# Patient Record
Sex: Male | Born: 1950 | Race: White | Hispanic: No | Marital: Married | Smoking: Former smoker
Health system: Southern US, Community
[De-identification: ages and names within clinical notes are randomized; demographics above are authoritative.]

## PROBLEM LIST (undated history)

## (undated) DIAGNOSIS — Z8614 Personal history of Methicillin resistant Staphylococcus aureus infection: Secondary | ICD-10-CM

## (undated) DIAGNOSIS — E7800 Pure hypercholesterolemia, unspecified: Secondary | ICD-10-CM

## (undated) DIAGNOSIS — Z860100 Personal history of colon polyps, unspecified: Secondary | ICD-10-CM

## (undated) DIAGNOSIS — R350 Frequency of micturition: Secondary | ICD-10-CM

## (undated) DIAGNOSIS — R3915 Urgency of urination: Secondary | ICD-10-CM

## (undated) DIAGNOSIS — N433 Hydrocele, unspecified: Secondary | ICD-10-CM

## (undated) DIAGNOSIS — G4730 Sleep apnea, unspecified: Secondary | ICD-10-CM

## (undated) DIAGNOSIS — G4733 Obstructive sleep apnea (adult) (pediatric): Secondary | ICD-10-CM

## (undated) DIAGNOSIS — E119 Type 2 diabetes mellitus without complications: Secondary | ICD-10-CM

## (undated) DIAGNOSIS — H60399 Other infective otitis externa, unspecified ear: Secondary | ICD-10-CM

## (undated) DIAGNOSIS — I10 Essential (primary) hypertension: Secondary | ICD-10-CM

## (undated) DIAGNOSIS — E785 Hyperlipidemia, unspecified: Secondary | ICD-10-CM

## (undated) DIAGNOSIS — M109 Gout, unspecified: Secondary | ICD-10-CM

## (undated) DIAGNOSIS — E1169 Type 2 diabetes mellitus with other specified complication: Secondary | ICD-10-CM

## (undated) DIAGNOSIS — R351 Nocturia: Secondary | ICD-10-CM

## (undated) DIAGNOSIS — E1165 Type 2 diabetes mellitus with hyperglycemia: Secondary | ICD-10-CM

## (undated) DIAGNOSIS — M1990 Unspecified osteoarthritis, unspecified site: Secondary | ICD-10-CM

## (undated) DIAGNOSIS — H6690 Otitis media, unspecified, unspecified ear: Secondary | ICD-10-CM

## (undated) HISTORY — DX: Other infective otitis externa, unspecified ear: H60.399

## (undated) HISTORY — DX: Hyperlipidemia, unspecified: E78.5

## (undated) HISTORY — DX: Essential (primary) hypertension: I10

## (undated) HISTORY — DX: Otitis media, unspecified, unspecified ear: H66.90

## (undated) HISTORY — DX: Pure hypercholesterolemia, unspecified: E78.00

## (undated) HISTORY — DX: Type 2 diabetes mellitus with hyperglycemia: E11.65

## (undated) HISTORY — DX: Gout, unspecified: M10.9

## (undated) HISTORY — DX: Type 2 diabetes mellitus with other specified complication: E11.69

## (undated) HISTORY — DX: Sleep apnea, unspecified: G47.30

---

## 2001-03-11 HISTORY — PX: OTHER SURGICAL HISTORY: SHX169

## 2002-10-27 ENCOUNTER — Inpatient Hospital Stay (HOSPITAL_COMMUNITY): Admission: AD | Admit: 2002-10-27 | Discharge: 2002-10-31 | Payer: Self-pay | Admitting: Orthopedic Surgery

## 2002-11-01 ENCOUNTER — Encounter (HOSPITAL_COMMUNITY): Admission: RE | Admit: 2002-11-01 | Discharge: 2002-12-01 | Payer: Self-pay | Admitting: Orthopedic Surgery

## 2004-05-05 ENCOUNTER — Emergency Department (HOSPITAL_COMMUNITY): Admission: EM | Admit: 2004-05-05 | Discharge: 2004-05-05 | Payer: Self-pay | Admitting: Emergency Medicine

## 2007-02-02 ENCOUNTER — Ambulatory Visit: Admission: RE | Admit: 2007-02-02 | Discharge: 2007-02-02 | Payer: Self-pay | Admitting: Pulmonary Disease

## 2007-02-19 ENCOUNTER — Ambulatory Visit: Payer: Self-pay | Admitting: Pulmonary Disease

## 2007-12-06 ENCOUNTER — Emergency Department (HOSPITAL_COMMUNITY): Admission: EM | Admit: 2007-12-06 | Discharge: 2007-12-06 | Payer: Self-pay | Admitting: Emergency Medicine

## 2008-05-16 ENCOUNTER — Emergency Department (HOSPITAL_COMMUNITY): Admission: EM | Admit: 2008-05-16 | Discharge: 2008-05-16 | Payer: Self-pay | Admitting: Emergency Medicine

## 2010-07-24 NOTE — Procedures (Signed)
Michael Odom, Michael Odom                ACCOUNT NO.:  [id number]   MEDICAL RECORD NO.:  [id number]          PATIENT TYPE:  OUT   LOCATION:  SLEEP LAB                     FACILITY:  [facility]   PHYSICIAN:  Barbaraann Share, MD,FCCPDATE OF BIRTH:  1950/07/20   DATE OF STUDY:  02/02/2007                            NOCTURNAL POLYSOMNOGRAM   REFERRING PHYSICIAN:  Ramon Dredge L. Juanetta Gosling, M.D.   LOCATION:  Sleep lab.   INDICATION FOR STUDY:  Hypersomnia with sleep apnea.   EPWORTH SLEEPINESS SCORE:  16   MEDICATIONS:   SLEEP ARCHITECTURE:  The patient had a total sleep time of 361 minutes  with decreased REM and very little slow-wave sleep.  Sleep onset latency  was very rapid at 2.5 minutes, and REM onset was normal as well.  Sleep  efficiency was mildly reduced at 89%.   RESPIRATORY DATA:  The patient underwent a split-night protocol, where  he was found to have 66 obstructive apneas, 19 central apneas, and 88  hypopneas over the entire night, for an apnea/hypopnea index of 29  events per hour.  The events were clearly worse in the supine position,  and there was very loud snoring noted throughout.  The patient met split-  night criteria a little after midnight and was placed on a CPAP mask and  titration was initiated.  The patient, from the very start, could not  tolerate even a CPAP pressure of 5 and was changed over to bilevel and  continued to have great difficulties.  The CPAP was discontinued by 2:00  a.m., because of the patient's complaint of shortness of breath  associated with the device.   OXYGEN DATA:  There is O2 desaturation as low as 80% with the patient's  obstructive events.   CARDIAC DATA:  No clinically significant arrhythmias were noted.   MOVEMENT-PARASOMNIA:  The patient was found to have 29 leg jerks, but  none with significant arousal or awakening.   IMPRESSIONS-RECOMMENDATIONS:  Moderate obstructive sleep apnea/hypopnea  syndrome, with an apnea/hypopnea index of 29  events per hour and O2  desaturation as low as 80%.  The patient could not tolerate CPAP or  bilevel at very low levels, due to claustrophobia and difficulty  breathing.  I would consider a trial of CPAP at a low level such as 8  centimeters with desensitization techniques and possibly a sedative  hypnotic short-term to  help with sleep onset.  Since the patient's sleep apnea is only moderate  in nature, other treatment options such as oral appliance or upper  airway surgery could also be considered.  Clinical correlation is  suggested.      Barbaraann Share, MD,FCCP  Diplomate, American Board of Sleep  Medicine  Electronically Signed     [facility]/MEDQ  D:  02/19/2007 16:31:57  T:  02/20/2007 11:29:05  Job:  [id number]

## 2010-07-27 NOTE — H&P (Signed)
Michael Odom, Michael Odom                           ACCOUNT NO.:  [id number]   MEDICAL RECORD NO.:  [id number]                   PATIENT TYPE:  INP   LOCATION:  A337                                 FACILITY:  [facility]   PHYSICIAN:  Vickki Hearing, M.D.           DATE OF BIRTH:  1950-10-12   DATE OF ADMISSION:  10/27/2002  DATE OF DISCHARGE:                                HISTORY & PHYSICAL   CHIEF COMPLAINT:  Staph infection, right knee.   HISTORY:  A 60 year old male status post incision and drainage for septic  arthritis.  This was done in [facility] by Dr. Ronnie Doss on October 22, 2002.  The patient was in the hospital for 3-4 days, and was placed on  clindamycin intravenously after cultures came back, and then sent home on  300 mg t.i.d. for 21 days.  He was told to see an orthopedist as soon as he  got home.  He was communicating through Dr. Juanetta Gosling.   The patient's symptoms first began approximately three weeks ago when he  started with an ear infection which was treated successfully with an  antibiotic.  He also had intermittent boils which would respond to  antibiotics.  He went to [place] for vacation, had an infection in his  ear treated with antibiotic drops, and then developed a boil in his right  knee, and that was treated with antibiotics.  He presented back for a follow  up visit at Urgent Care.  His ear was treated with irrigation, as well as  his knee through a local incision and drainage; however, his knee worsened,  and he presented to the [facility] and was taken to the  operating room as described above.  He grew MRSA Staphylococcal infection  sensitive to clindamycin.  After his drainage, he had intravenous  antibiotics.  Eventually switched to IV clindamycin, and p.o. clindamycin to  go home.  On this occasion seeing me today on October 27, 2002, his knee is  still red, on the medial aspect especially with cellulitis.  The inferior  pole  of the wound is open and draining serosanguinous fluid.  the superior  portion of the wound has three stitches, and the suture line is red and  cellulitis, as well.  The good thing is he has no pain in the knee and can  walk without a limp and has good passive range of motion.   REVIEW OF SYSTEMS:  He had fever and fatigue, joint pain, joint swelling,  itching.  He developed a rash of some kind, perhaps from antibiotics or pain  medication using Tylox.  EAR/NOSE/THROAT:  Poor hearing since the infections  started.  HEART:  Negative.  RESPIRATORY:  Negative.  GI:  Negative.  URINARY:  Negative.  NEUROLOGIC:  Negative.  ENDOCRINE:  Negative.  PSYCHIATRIC:  Negative.  IMMUNOLOGIC:  Negative.  LYMPHATIC:  Negative.  ALLERGIES:  1. SULFA.  2. BACITRACIN.  3. GUAIFENESIN.  4. VANCOMYCIN.   MEDICAL PROBLEMS:  1. Previous history of back problems with bulging disk.  2. Hypercholesterolemia.   PREVIOUS SURGERY:  Just on the knee on the right.   MEDICATIONS:  1. Lipitor 10 mg.  2. Ciprodex drops for ears.  3. Clindamycin.  4. Tylox.   FAMILY HISTORY:  Arthritis, cancer.   SOCIAL HISTORY:  Married.  No smoking, no drinking.  Caffeine _______.  Grades completed 1-11.   PHYSICAL EXAMINATION:  GENERAL APPEARANCE:  He is a well-developed, well-  nourished male.  Grooming and hygiene are normal.  He is large in stature.  He has no deformity.  VITAL SIGNS:  Recorded in the medical record.  CARDIOVASCULAR:  Swelling - none.  Edema and tenderness - none.  Varicose  veins - none.  Temperature normal, pulse is normal.  LYMPHATICS:  Regional lower leg, groin nodes are negative.  MUSCULOSKELETAL, GAIT, AND STATION:  He does not appear to limp on the right  leg.  The right leg has a longitudinal lateral incision.  The distal portion  is open.  The medial side of the leg is red and blanching, suggesting  cellulitis.  This area is not specifically tender, however.  He also has  three stitches at  the top of the wound, and the suture line is red and  blanching.  Range of motion is 10-100 without pain.  It is supple.  He has  gross normal motor strength and muscle tone.  His other three extremities  are not affected.  They are well aligned.  There are no boils.  The  stability and strength of the limbs are normal.  Range of motion is normal.  SKIN:  As stated on the right knee; otherwise, he has a rash on the chest  and back.  NEUROLOGIC:  He is intact.  He is alert and oriented x3.  There is no  depression or anxiety.   MEDICAL DECISION MAKING:  The notes from the Urgent Care Center, the  operative note, the discharge summary, the history and physical from [facility], along with culture reports and results are reviewed,  indicating the patient's history and bacterial infection.   IMPRESSION:  Septic arthritis.   PLAN:  Readmit for IV antibiotics.   We did discuss the possibility of incision and drainage on a second  occasion, but for now we will observe him on antibiotics.                                               Vickki Hearing, M.D.    SEH/MEDQ  D:  10/27/2002  T:  10/27/2002  Job:  [id number]

## 2010-07-27 NOTE — Group Therapy Note (Signed)
   NAMEKOAH, CHISENHALL                           ACCOUNT NO.:  [id number]   MEDICAL RECORD NO.:  [id number]                   PATIENT TYPE:  INP   LOCATION:  A337                                 FACILITY:  [facility]   PHYSICIAN:  Vickki Hearing, M.D.           DATE OF BIRTH:  1951/01/22   DATE OF PROCEDURE:  10/29/2002  DATE OF DISCHARGE:                                   PROGRESS NOTE   SUBJECTIVE/OBJECTIVE:  The patient is now on his third day of clindamycin  for a septic knee.  He was initially treated in [facility] and had follow-  up care here.  His knee continues to improve, but slowly.  He has no  increase in pain.  The cellulitis on the medial side of the knee continues  to improve and the wound continues to improve.  There is less erythema  around the suture line.  Again, he has no increase in pain.   ASSESSMENT:  Overall improving but slowly.  He has a new boil coming up  behind his left ear which is somewhat concerning since he is already on  clindamycin.   PLAN:  Continue lavage, continue clindamycin - I would like him to get a  full five-day course.  Tomorrow I would like to get a sed rate and C-  reactive protein repeat as well as a white count.                                               Vickki Hearing, M.D.    SEH/MEDQ  D:  10/29/2002  T:  10/29/2002  Job:  [id number]

## 2010-07-27 NOTE — Discharge Summary (Signed)
   Michael Odom, Michael Odom                           ACCOUNT NO.:  [id number]   MEDICAL RECORD NO.:  [id number]                   PATIENT TYPE:  INP   LOCATION:  A337                                 FACILITY:  [facility]   PHYSICIAN:  Vickki Hearing, M.D.           DATE OF BIRTH:  06-Aug-1950   DATE OF ADMISSION:  10/27/2002  DATE OF DISCHARGE:  10/31/2002                                 DISCHARGE SUMMARY   ADMISSION DIAGNOSIS:  Staphylococcus infection right knee.   HISTORY:  This is a 60 year old male who had an incision and drainage for  septic arthritis in [facility] by Dr. Ferd Glassing on October 22, 2002.  The  patient was in the hospital for 3 days and was placed on clindamycin  intravenously after cultures came back.  He was then sent home on 300 mg  t.i.d. for 21 days and told to see an orthopedist as well as he got home.   Once he got here in [place], I saw him in the office and his cellulitis  appeared to be returning, although he was not having a significant amount of  knee pain. He was admitted for IV clindamycin.  He had IV clindamycin 300 mg  3 times a day along with pulse lavage of the distal open wound and improved  and remained afebrile.  On August 18 his sedimentation rate was 58 on August  21 it was 46.  His C-reactive protein on the 18th showed 34.8.  On August 21  C-reactive protein was pending, but clinically he had improved.   DISPOSITION:  To home.   CONDITION:  Overall improved.   As outpatient should have physical therapy for continue pulse lavage and  range of motion exercises right knee.   FOLLOW UP:  Wednesday in the office.   DISCHARGE MEDICATIONS:  Clindamycin 300 mg t.i.d. p.o.  We will use that for  1 month. Also he has Tylox prescription already filled 1 q.4-6h. p.r.n. for  pain.  That can be changed to Lorcet or Lorcet Plus once that first  prescription has run out.                                               Vickki Hearing, M.D.    SEH/MEDQ  D:  10/31/2002  T:  11/01/2002  Job:  [id number]

## 2010-12-10 LAB — URINALYSIS, ROUTINE W REFLEX MICROSCOPIC
Hgb urine dipstick: NEGATIVE
Leukocytes, UA: NEGATIVE
Protein, ur: 30 — AB
Urobilinogen, UA: 0.2

## 2010-12-10 LAB — URINE MICROSCOPIC-ADD ON

## 2011-08-16 ENCOUNTER — Encounter (INDEPENDENT_AMBULATORY_CARE_PROVIDER_SITE_OTHER): Payer: Self-pay

## 2013-03-01 ENCOUNTER — Ambulatory Visit (HOSPITAL_COMMUNITY)
Admission: RE | Admit: 2013-03-01 | Discharge: 2013-03-01 | Disposition: A | Discharge status: Home / Self Care | Payer: Managed Care, Other (non HMO) | Source: Ambulatory Visit | Attending: Pulmonary Disease | Admitting: Pulmonary Disease

## 2013-03-01 ENCOUNTER — Other Ambulatory Visit (HOSPITAL_COMMUNITY): Payer: Self-pay | Admitting: Pulmonary Disease

## 2013-03-01 DIAGNOSIS — M898X9 Other specified disorders of bone, unspecified site: Secondary | ICD-10-CM | POA: Insufficient documentation

## 2013-03-01 DIAGNOSIS — M7730 Calcaneal spur, unspecified foot: Secondary | ICD-10-CM | POA: Insufficient documentation

## 2013-03-01 DIAGNOSIS — M25476 Effusion, unspecified foot: Secondary | ICD-10-CM | POA: Insufficient documentation

## 2013-03-01 DIAGNOSIS — M25473 Effusion, unspecified ankle: Secondary | ICD-10-CM | POA: Insufficient documentation

## 2013-03-01 DIAGNOSIS — M25572 Pain in left ankle and joints of left foot: Secondary | ICD-10-CM

## 2013-04-06 ENCOUNTER — Encounter: Payer: Self-pay | Admitting: Family Medicine

## 2013-08-17 ENCOUNTER — Encounter: Payer: Self-pay | Admitting: Family Medicine

## 2013-10-13 ENCOUNTER — Encounter (INDEPENDENT_AMBULATORY_CARE_PROVIDER_SITE_OTHER): Payer: Self-pay | Admitting: *Deleted

## 2013-10-26 ENCOUNTER — Other Ambulatory Visit (HOSPITAL_COMMUNITY): Payer: Self-pay | Admitting: Pulmonary Disease

## 2013-10-26 DIAGNOSIS — N5089 Other specified disorders of the male genital organs: Secondary | ICD-10-CM

## 2013-10-28 ENCOUNTER — Other Ambulatory Visit (HOSPITAL_COMMUNITY): Payer: Self-pay | Admitting: Pulmonary Disease

## 2013-10-28 ENCOUNTER — Ambulatory Visit (HOSPITAL_COMMUNITY)
Admission: RE | Admit: 2013-10-28 | Discharge: 2013-10-28 | Disposition: A | Discharge status: Home / Self Care | Payer: Managed Care, Other (non HMO) | Source: Ambulatory Visit | Attending: Pulmonary Disease | Admitting: Pulmonary Disease

## 2013-10-28 DIAGNOSIS — N433 Hydrocele, unspecified: Secondary | ICD-10-CM | POA: Insufficient documentation

## 2013-10-28 DIAGNOSIS — N5089 Other specified disorders of the male genital organs: Secondary | ICD-10-CM

## 2013-10-28 DIAGNOSIS — N508 Other specified disorders of male genital organs: Secondary | ICD-10-CM | POA: Diagnosis present

## 2013-11-11 ENCOUNTER — Other Ambulatory Visit (INDEPENDENT_AMBULATORY_CARE_PROVIDER_SITE_OTHER): Payer: Self-pay | Admitting: *Deleted

## 2013-11-11 ENCOUNTER — Telehealth (INDEPENDENT_AMBULATORY_CARE_PROVIDER_SITE_OTHER): Payer: Self-pay | Admitting: *Deleted

## 2013-11-11 DIAGNOSIS — Z1211 Encounter for screening for malignant neoplasm of colon: Secondary | ICD-10-CM

## 2013-11-11 DIAGNOSIS — Z86010 Personal history of colonic polyps: Secondary | ICD-10-CM

## 2013-11-11 NOTE — Telephone Encounter (Signed)
Patient needs movi prep 

## 2013-11-12 MED ORDER — PEG-KCL-NACL-NASULF-NA ASC-C 100 G PO SOLR: 1.0000 | Freq: Once | ORAL | Status: DC

## 2013-12-03 ENCOUNTER — Telehealth (INDEPENDENT_AMBULATORY_CARE_PROVIDER_SITE_OTHER): Payer: Self-pay | Admitting: *Deleted

## 2013-12-03 NOTE — Telephone Encounter (Signed)
PCP: hawkins   Procedure: tcs  Reason/Indication:  Hx polyps  Has patient had this procedure before?  Yes, 2008 --scanned  If so, when, by whom and where?    Is there a family history of colon cancer?  no  Who?  What age when diagnosed?    Is patient diabetic?   yes      Does patient have prosthetic heart valve?  no  Do you have a pacemaker?  no  Has patient ever had endocarditis? no  Has patient had joint replacement within last 12 months?  no  Does patient tend to be constipated or take laxatives? no  Is patient on Coumadin, Plavix and/or Aspirin? no  Medications: metformin 500mg  bid, simvastatin 40 mg daily, doxycycline 100 mg daily, fluticasone 50 mcg, cirpodex drops prn, vit c 500 mg bid, centrum silver daily, fish oil daily  Allergies: sulfur, bacitracin, vancomycin, guaifenesin   Medication Adjustment: hold metformin evening before and morning of procedure  Procedure date & time: 12/29/13 at 1200

## 2013-12-06 NOTE — Telephone Encounter (Signed)
agree

## 2013-12-15 ENCOUNTER — Encounter (HOSPITAL_COMMUNITY): Payer: Self-pay | Admitting: Pharmacy Technician

## 2013-12-17 ENCOUNTER — Ambulatory Visit (INDEPENDENT_AMBULATORY_CARE_PROVIDER_SITE_OTHER): Payer: Managed Care, Other (non HMO) | Admitting: Urology

## 2013-12-17 DIAGNOSIS — N433 Hydrocele, unspecified: Secondary | ICD-10-CM

## 2013-12-29 ENCOUNTER — Encounter (HOSPITAL_COMMUNITY): Payer: Self-pay | Admitting: *Deleted

## 2013-12-29 ENCOUNTER — Encounter (HOSPITAL_COMMUNITY)
Admission: RE | Disposition: A | Discharge status: Home / Self Care | Payer: Self-pay | Source: Ambulatory Visit | Attending: Internal Medicine

## 2013-12-29 ENCOUNTER — Ambulatory Visit (HOSPITAL_COMMUNITY)
Admission: RE | Admit: 2013-12-29 | Discharge: 2013-12-29 | Disposition: A | Discharge status: Home / Self Care | Payer: Managed Care, Other (non HMO) | Source: Ambulatory Visit | Attending: Internal Medicine | Admitting: Internal Medicine

## 2013-12-29 ENCOUNTER — Other Ambulatory Visit (INDEPENDENT_AMBULATORY_CARE_PROVIDER_SITE_OTHER): Payer: Self-pay | Admitting: Internal Medicine

## 2013-12-29 DIAGNOSIS — Z79899 Other long term (current) drug therapy: Secondary | ICD-10-CM | POA: Diagnosis not present

## 2013-12-29 DIAGNOSIS — K5730 Diverticulosis of large intestine without perforation or abscess without bleeding: Secondary | ICD-10-CM | POA: Insufficient documentation

## 2013-12-29 DIAGNOSIS — Z7951 Long term (current) use of inhaled steroids: Secondary | ICD-10-CM | POA: Diagnosis not present

## 2013-12-29 DIAGNOSIS — D125 Benign neoplasm of sigmoid colon: Secondary | ICD-10-CM | POA: Diagnosis not present

## 2013-12-29 DIAGNOSIS — I771 Stricture of artery: Secondary | ICD-10-CM | POA: Insufficient documentation

## 2013-12-29 DIAGNOSIS — D120 Benign neoplasm of cecum: Secondary | ICD-10-CM | POA: Insufficient documentation

## 2013-12-29 DIAGNOSIS — Z87891 Personal history of nicotine dependence: Secondary | ICD-10-CM | POA: Diagnosis not present

## 2013-12-29 DIAGNOSIS — D123 Benign neoplasm of transverse colon: Secondary | ICD-10-CM

## 2013-12-29 DIAGNOSIS — E119 Type 2 diabetes mellitus without complications: Secondary | ICD-10-CM | POA: Diagnosis not present

## 2013-12-29 DIAGNOSIS — Z1211 Encounter for screening for malignant neoplasm of colon: Secondary | ICD-10-CM | POA: Diagnosis present

## 2013-12-29 DIAGNOSIS — R221 Localized swelling, mass and lump, neck: Secondary | ICD-10-CM

## 2013-12-29 DIAGNOSIS — E780 Pure hypercholesterolemia: Secondary | ICD-10-CM | POA: Diagnosis not present

## 2013-12-29 DIAGNOSIS — K644 Residual hemorrhoidal skin tags: Secondary | ICD-10-CM | POA: Diagnosis not present

## 2013-12-29 DIAGNOSIS — Z86010 Personal history of colonic polyps: Secondary | ICD-10-CM

## 2013-12-29 HISTORY — PX: COLONOSCOPY: SHX5424

## 2013-12-29 LAB — HM COLONOSCOPY

## 2013-12-29 LAB — GLUCOSE, CAPILLARY: GLUCOSE-CAPILLARY: 88 mg/dL (ref 70–99)

## 2013-12-29 SURGERY — COLONOSCOPY
Anesthesia: Moderate Sedation

## 2013-12-29 MED ORDER — MEPERIDINE HCL 50 MG/ML IJ SOLN
INTRAMUSCULAR | Status: AC
  Filled 2013-12-29: qty 1

## 2013-12-29 MED ORDER — STERILE WATER FOR IRRIGATION IR SOLN
Status: DC | PRN
  Administered 2013-12-29: 11:00:00

## 2013-12-29 MED ORDER — MEPERIDINE HCL 50 MG/ML IJ SOLN
INTRAMUSCULAR | Status: DC | PRN
  Administered 2013-12-29 (×2): 25 mg via INTRAVENOUS

## 2013-12-29 MED ORDER — MIDAZOLAM HCL 5 MG/5ML IJ SOLN
INTRAMUSCULAR | Status: DC | PRN
  Administered 2013-12-29 (×2): 2 mg via INTRAVENOUS
  Administered 2013-12-29: 1 mg via INTRAVENOUS
  Administered 2013-12-29: 2 mg via INTRAVENOUS

## 2013-12-29 MED ORDER — MIDAZOLAM HCL 5 MG/5ML IJ SOLN
INTRAMUSCULAR | Status: AC
  Filled 2013-12-29: qty 10

## 2013-12-29 MED ORDER — SODIUM CHLORIDE 0.9 % IV SOLN
INTRAVENOUS | Status: DC
  Administered 2013-12-29: 11:00:00 via INTRAVENOUS

## 2013-12-29 NOTE — Discharge Instructions (Signed)
Resume usual medications and high fiber diet. No driving for 24 hours. Ultrasound of right supraclavicular area to be done today. Physician will call with biopsy and ultrasound results.  High-Fiber Diet Fiber is found in fruits, vegetables, and grains. A high-fiber diet encourages the addition of more whole grains, legumes, fruits, and vegetables in your diet. The recommended amount of fiber for adult males is 38 g per day. For adult females, it is 25 g per day. Pregnant and lactating women should get 28 g of fiber per day. If you have a digestive or bowel problem, ask your caregiver for advice before adding high-fiber foods to your diet. Eat a variety of high-fiber foods instead of only a select few type of foods.  PURPOSE  To increase stool bulk.  To make bowel movements more regular to prevent constipation.  To lower cholesterol.  To prevent overeating. WHEN IS THIS DIET USED?  It may be used if you have constipation and hemorrhoids.  It may be used if you have uncomplicated diverticulosis (intestine condition) and irritable bowel syndrome.  It may be used if you need help with weight management.  It may be used if you want to add it to your diet as a protective measure against atherosclerosis, diabetes, and cancer. SOURCES OF FIBER  Whole-grain breads and cereals.  Fruits, such as apples, oranges, bananas, berries, prunes, and pears.  Vegetables, such as green peas, carrots, sweet potatoes, beets, broccoli, cabbage, spinach, and artichokes.  Legumes, such split peas, soy, lentils.  Almonds. FIBER CONTENT IN FOODS Starches and Grains / Dietary Fiber (g)  Cheerios, 1 cup / 3 g  Corn Flakes cereal, 1 cup / 0.7 g  Rice crispy treat cereal, 1 cup / 0.3 g  Instant oatmeal (cooked),  cup / 2 g  Frosted wheat cereal, 1 cup / 5.1 g  Brown, long-grain rice (cooked), 1 cup / 3.5 g  White, long-grain rice (cooked), 1 cup / 0.6 g  Enriched macaroni (cooked), 1 cup / 2.5  g Legumes / Dietary Fiber (g)  Baked beans (canned, plain, or vegetarian),  cup / 5.2 g  Kidney beans (canned),  cup / 6.8 g  Pinto beans (cooked),  cup / 5.5 g Breads and Crackers / Dietary Fiber (g)  Plain or honey graham crackers, 2 squares / 0.7 g  Saltine crackers, 3 squares / 0.3 g  Plain, salted pretzels, 10 pieces / 1.8 g  Whole-wheat bread, 1 slice / 1.9 g  White bread, 1 slice / 0.7 g  Raisin bread, 1 slice / 1.2 g  Plain bagel, 3 oz / 2 g  Flour tortilla, 1 oz / 0.9 g  Corn tortilla, 1 small / 1.5 g  Hamburger or hotdog bun, 1 small / 0.9 g Fruits / Dietary Fiber (g)  Apple with skin, 1 medium / 4.4 g  Sweetened applesauce,  cup / 1.5 g  Banana,  medium / 1.5 g  Grapes, 10 grapes / 0.4 g  Orange, 1 small / 2.3 g  Raisin, 1.5 oz / 1.6 g  Melon, 1 cup / 1.4 g Vegetables / Dietary Fiber (g)  Green beans (canned),  cup / 1.3 g  Carrots (cooked),  cup / 2.3 g  Broccoli (cooked),  cup / 2.8 g  Peas (cooked),  cup / 4.4 g  Mashed potatoes,  cup / 1.6 g  Lettuce, 1 cup / 0.5 g  Corn (canned),  cup / 1.6 g  Tomato,  cup / 1.1 g Document  Released: 02/25/2005 Document Revised: 08/27/2011 Document Reviewed: 05/30/2011 [facility] Patient Information 2015 [place], [place]. This information is not intended to replace advice given to you by your health care provider. Make sure you discuss any questions you have with your health care provider. Colonoscopy, Care After These instructions give you information on caring for yourself after your procedure. Your doctor may also give you more specific instructions. Call your doctor if you have any problems or questions after your procedure. HOME CARE  Do not drive for 24 hours.  Do not sign important papers or use machinery for 24 hours.  You may shower.  You may go back to your usual activities, but go slower for the first 24 hours.  Take rest breaks often during the first 24 hours.  Walk  around or use warm packs on your belly (abdomen) if you have belly cramping or gas.  Drink enough fluids to keep your pee (urine) clear or pale yellow.  Resume your normal diet. Avoid heavy or fried foods.  Avoid drinking alcohol for 24 hours or as told by your doctor.  Only take medicines as told by your doctor. If a tissue sample (biopsy) was taken during the procedure:   Do not take aspirin or blood thinners for 7 days, or as told by your doctor.  Do not drink alcohol for 7 days, or as told by your doctor.  Eat soft foods for the first 24 hours. GET HELP IF: You still have a small amount of blood in your poop (stool) 2-3 days after the procedure. GET HELP RIGHT AWAY IF:  You have more than a small amount of blood in your poop.  You see clumps of tissue (blood clots) in your poop.  Your belly is puffy (swollen).  You feel sick to your stomach (nauseous) or throw up (vomit).  You have a fever.  You have belly pain that gets worse and medicine does not help. MAKE SURE YOU:  Understand these instructions.  Will watch your condition.  Will get help right away if you are not doing well or get worse. Document Released: 03/30/2010 Document Revised: 03/02/2013 Document Reviewed: 11/02/2012 [facility] Patient Information 2015 [place], [place]. This information is not intended to replace advice given to you by your health care provider. Make sure you discuss any questions you have with your health care provider.

## 2013-12-29 NOTE — Op Note (Signed)
COLONOSCOPY PROCEDURE REPORT  PATIENT:  Michael Odom  MR#:  [id number] Birthdate:  1950-09-29, 63 y.o., male Endoscopist:  Dr. Rogene Houston, MD Referred By:  Dr. Jasper Loser. Luan Pulling, MD  Procedure Date: 12/29/2013  Procedure:   Colonoscopy  Indications:  Patient is 63 year old Caucasian male who has history of colonic adenomas and is here for surveillance colonoscopy. His last exam was 7 years ago with removal of two small tubular adenomas. Family history is negative for CRC.  Informed Consent:  The procedure and risks were reviewed with the patient and informed consent was obtained.  Medications:  Demerol 50 mg IV Versed 7 mg IV  Description of procedure:  After a digital rectal exam was performed, that colonoscope was advanced from the anus through the rectum and colon to the area of the cecum, ileocecal valve and appendiceal orifice. The cecum was deeply intubated. These structures were well-seen and photographed for the record. From the level of the cecum and ileocecal valve, the scope was slowly and cautiously withdrawn. The mucosal surfaces were carefully surveyed utilizing scope tip to flexion to facilitate fold flattening as needed. The scope was pulled down into the rectum where a thorough exam including retroflexion was performed.  Findings:   Prep excellent. Three small polyps were ablated via cold biopsy and submitted together. These were located at cecum, transverse and proximal sigmoid colon. 5 mm polyp was cold snared from the distal sigmoid colon and submitted separately. Multiple diverticula noted at sigmoid colon. Prominent hemorrhoids noted below the dentate line.    Therapeutic/Diagnostic Maneuvers Performed:  See above  Complications:  None  Cecal Withdrawal Time:  19 minutes  Impression:  Examination performed to cecum. Three small polyps were ablated via cold biopsy and submitted together(cecum, transverse and sigmoid colon). 5 mm polyp cold snare from  distal sigmoid colon and submitted separately. Multiple diverticula at sigmoid colon. External hemorrhoids.  Recommendations:  Standard instructions given. High fiber diet. Patient will have ultrasound of right supraclavicular area today to rule out subclavian artery aneurysm. I will contact patient with biopsy results and further recommendations.  REHMAN,NAJEEB U  12/29/2013 11:59 AM  CC: Dr. Alonza Bogus, MD & Dr. Rayne Du ref. provider found

## 2013-12-29 NOTE — H&P (Signed)
Michael Odom is an 63 y.o. male.   Chief Complaint: Patient is here for colonoscopy. HPI: Patient is 63 year old Caucasian male who has history of colonic adenomas and is here for surveillance colonoscopy. His last exam was in August 2008 or 7 years ago with removal of 2 small polyps which are tubular adenomas. He denies change in bowel habits rectal bleeding or abdominal pain. Family history is negative for CRC.  Past Medical History  Diagnosis Date  . Hypercholesteremia   . Diabetes mellitus without complication   . Colon polyps     Past Surgical History  Procedure Laterality Date  . Colonoscopy    . I & d right knee      Family History  Problem Relation Age of Onset  . Colon cancer Neg Hx    Social History:  reports that he quit smoking about 18 years ago. His smoking use included Cigarettes. He has a 30 pack-year smoking history. He does not have any smokeless tobacco history on file. He reports that he does not drink alcohol or use illicit drugs.  Allergies:  Allergies  Allergen Reactions  . Bacitracin     Cannot remember  . Guaifenesin & Derivatives     Rash and itching  . Sulfa Antibiotics     Any thing with sulfa or sulfa deriratives  . Vancomycin     redman syndrome    Medications Prior to Admission  Medication Sig Dispense Refill  . fluticasone (FLONASE) 50 MCG/ACT nasal spray Place 2 sprays into both nostrils daily.      . metFORMIN (GLUCOPHAGE) 500 MG tablet Take 500 mg by mouth 2 (two) times daily with a meal.      . Multiple Vitamin (MULTIVITAMIN WITH MINERALS) TABS tablet Take 1 tablet by mouth daily.      Marland Kitchen omega-3 acid ethyl esters (LOVAZA) 1 G capsule Take 1 g by mouth at bedtime.      . peg 3350 powder (MOVIPREP) 100 G SOLR Take 1 kit (200 g total) by mouth once.  1 kit  0  . simvastatin (ZOCOR) 40 MG tablet Take 40 mg by mouth at bedtime.      . vitamin C (ASCORBIC ACID) 500 MG tablet Take 500 mg by mouth 2 (two) times daily.      .  ciprofloxacin-dexamethasone (CIPRODEX) otic suspension Place 1-2 drops into both ears 2 (two) times daily as needed.      . doxycycline (VIBRA-TABS) 100 MG tablet Take 100 mg by mouth daily.        No results found for this or any previous visit (from the past 48 hour(s)). No results found.  ROS  Blood pressure 166/96, pulse 68, temperature 97.7 F (36.5 C), temperature source Oral, resp. rate 20, height '5\' 8"'  (1.727 m), weight 240 lb (108.863 kg), SpO2 99.00%. Physical Exam  Constitutional: He appears well-developed and well-nourished.  HENT:  Mouth/Throat: Oropharynx is clear and moist.  Eyes: Conjunctivae are normal. No scleral icterus.  Neck:  Pulsatile expanded blood vessel in right supra-clavicular region  Cardiovascular: Normal rate, regular rhythm and normal heart sounds.   No murmur heard. Respiratory: Effort normal and breath sounds normal.  GI: Soft. He exhibits no distension and no mass. There is no tenderness.  Small umbilical hernia which is completely reducible.  Genitourinary:  Right-sided hydrocele about the size of grapefruit  Musculoskeletal: He exhibits no edema.  Neurological: He is alert.  Skin: Skin is warm and dry.     Assessment/Plan  History of colonic adenomas. Surveillance colonoscopy.  Pulsatile area and right supraclavicular region ultrasound would be obtained to make sure he does not have small aneurysm.  Michael Odom U 12/29/2013, 11:03 AM

## 2013-12-31 ENCOUNTER — Encounter (HOSPITAL_COMMUNITY): Payer: Self-pay | Admitting: Internal Medicine

## 2014-01-05 ENCOUNTER — Encounter (HOSPITAL_COMMUNITY): Payer: Self-pay | Admitting: *Deleted

## 2014-01-06 ENCOUNTER — Other Ambulatory Visit: Payer: Self-pay | Admitting: Urology

## 2014-01-07 ENCOUNTER — Encounter (HOSPITAL_COMMUNITY): Payer: Self-pay | Admitting: *Deleted

## 2014-01-07 NOTE — Progress Notes (Signed)
NPO AFTER MN.  ARRIVE AT 0600.  NEEDS ISTAT AND EKG.  

## 2014-01-10 NOTE — H&P (Signed)
Active Problems Problems  1. 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 Luan Pulling for right testicular swelling from a hydrocele. He has had progressive enlargement over the last 2 years. He doesn't have pain but it is in way. He has had a scrotal US which confirmed the hydrocele and normal testes. He has no voiding complaints or prior GU history.   Past Medical History Problems  1. History of diabetes mellitus (Z86.39) 2. History of hypercholesterolemia (Z86.39) 3. History of sleep apnea (Z87.09)  Surgical History Problems  1. History of Knee Surgery  Current Meds 1. Centrum Silver Oral Tablet;  Therapy: (Recorded:09Oct2015) to Recorded 2. Ciprodex 0.3-0.1 % Otic Suspension;  Therapy: (Recorded:09Oct2015) to Recorded 3. Doxycycline Hyclate 100 MG Oral Capsule;  Therapy: (Recorded:09Oct2015) to Recorded 4. Fish Oil 600 MG Oral Capsule;  Therapy: (Recorded:09Oct2015) to Recorded 5. Fluticasone Propionate 50 MCG/ACT Nasal Suspension;  Therapy: (Recorded:09Oct2015) to Recorded 6. MetFORMIN HCl ER 500 MG Oral Tablet Extended Release 24 Hour;  Therapy: (Recorded:09Oct2015) to Recorded 7. Simvastatin 40 MG Oral Tablet;  Therapy: (Recorded:09Oct2015) to Recorded 8. Vitamin C TABS;  Therapy: (Recorded:09Oct2015) to Recorded  Allergies Medication  1. Bacitracin OINT 2. Sulfa Drugs 3. Vancomycin HCl SOLR  Family History Problems  1. Family history of Deceased : Father 2. Family history of malignant neoplasm of breast (Z80.3) : Mother 3. Family history of Melanoma : Father  Social History Problems    Caffeine use (F15.90)   Former tobacco use [id number]   Married   Number of children   Retired  Review of Systems Genitourinary, constitutional, skin, eye, otolaryngeal, hematologic/lymphatic, cardiovascular, pulmonary, endocrine, musculoskeletal, gastrointestinal, neurological and psychiatric  system(s) were reviewed and pertinent findings if present are noted.  Genitourinary: nocturia.    Vitals Vital Signs [Data Includes: Last 1 Day]  Recorded: 09Oct2015 09:07AM  Height: 5 ft 8 in Weight: 240 lb  BMI Calculated: 36.49 BSA Calculated: 2.21 Blood Pressure: 139 / 82 Temperature: 98.6 F Heart Rate: 70  Physical Exam Constitutional: Well nourished and well developed . No acute distress.  ENT:. The ears and nose are normal in appearance.  Neck: The appearance of the neck is normal and no neck mass is present.  Pulmonary: No respiratory distress and normal respiratory rhythm and effort.  Cardiovascular: Heart rate and rhythm are normal . No peripheral edema.  Abdomen: The abdomen is soft and nontender. No masses are palpated. No CVA tenderness.  An umbilical hernia is present.  No inguinal hernia is present on the right.  No inguinal hernia is present on the left. No hepatosplenomegaly noted.  Genitourinary: Examination of the penis demonstrates no discharge, no masses, no lesions and a normal meatus. The penis is uncircumcised. The scrotum is normal in appearance (except for a large right hydrocele. ) and without lesions. Examination of the right scrotum demonstrates a hydrocele. The left epididymis is palpably normal and non-tender. The right testis is nonpalpable. The left testis is non-tender and without masses.  Lymphatics: The femoral and inguinal nodes are not enlarged or tender.  Skin: Normal skin turgor, no visible rash and no visible skin lesions.  Neuro/Psych:. Mood and affect are appropriate.    Results/Data Urine [Data Includes: Last 1 Day]   09Oct2015  COLOR YELLOW   APPEARANCE CLEAR   SPECIFIC GRAVITY 1.025   pH 5.0   GLUCOSE NEG mg/dL  BILIRUBIN NEG   KETONE NEG mg/dL  BLOOD NEG  PROTEIN NEG mg/dL  UROBILINOGEN 0.2 mg/dL  NITRITE NEG   LEUKOCYTE ESTERASE NEG    Assessment Assessed  1. 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   Plan Hydrocele of testis  1. Follow-up Schedule Surgery Office  Follow-up  Status: Hold For - Appointment   Requested for: 09Oct2015 2. UA With REFLEX; [Do Not Release]; Status:Resulted - Requires Verification;   Done:  [id number] 09:07AM  I will get him set up for a hydrocelectomy and reviewed the risks of bleeding, infection, recurrence, testicular injury, thrombotic events and anesthetic complications.   Discussion/Summary CC: Dr. Velvet Bathe.

## 2014-01-10 NOTE — Anesthesia Preprocedure Evaluation (Addendum)
Anesthesia Evaluation  Patient identified by MRN, date of birth, ID band Patient awake    Reviewed: Allergy & Precautions, H&P , NPO status , Patient's Chart, lab work & pertinent test results  Airway Mallampati: III  TM Distance: >3 FB Neck ROM: full    Dental no notable dental hx. (+) Teeth Intact, Dental Advisory Given   Pulmonary neg pulmonary ROS, sleep apnea , former smoker,  breath sounds clear to auscultation  Pulmonary exam normal       Cardiovascular Exercise Tolerance: Good negative cardio ROS  Rhythm:regular Rate:Normal     Neuro/Psych negative neurological ROS  negative psych ROS   GI/Hepatic negative GI ROS, Neg liver ROS,   Endo/Other  negative endocrine ROSdiabetes, Well Controlled, Type 2, Oral Hypoglycemic Agents  Renal/GU negative Renal ROS  negative genitourinary   Musculoskeletal   Abdominal   Peds  Hematology negative hematology ROS (+)   Anesthesia Other Findings   Reproductive/Obstetrics negative OB ROS                            Anesthesia Physical Anesthesia Plan  ASA: III  Anesthesia Plan: General   Post-op Pain Management:    Induction: Intravenous  Airway Management Planned: LMA  Additional Equipment:   Intra-op Plan:   Post-operative Plan:   Informed Consent: I have reviewed the patients History and Physical, chart, labs and discussed the procedure including the risks, benefits and alternatives for the proposed anesthesia with the patient or authorized representative who has indicated his/her understanding and acceptance.   Dental Advisory Given  Plan Discussed with: CRNA and Surgeon  Anesthesia Plan Comments:         Anesthesia Quick Evaluation

## 2014-01-11 ENCOUNTER — Inpatient Hospital Stay (HOSPITAL_BASED_OUTPATIENT_CLINIC_OR_DEPARTMENT_OTHER): Payer: Managed Care, Other (non HMO) | Admitting: Anesthesiology

## 2014-01-11 ENCOUNTER — Encounter: Payer: Self-pay | Admitting: Family Medicine

## 2014-01-11 ENCOUNTER — Ambulatory Visit (HOSPITAL_COMMUNITY)
Admission: RE | Admit: 2014-01-11 | Discharge: 2014-01-11 | Disposition: A | Discharge status: Home / Self Care | Payer: Managed Care, Other (non HMO) | Source: Ambulatory Visit | Attending: Urology | Admitting: Urology

## 2014-01-11 ENCOUNTER — Encounter (HOSPITAL_COMMUNITY)
Admission: RE | Disposition: A | Discharge status: Home / Self Care | Payer: Self-pay | Source: Ambulatory Visit | Attending: Urology

## 2014-01-11 ENCOUNTER — Encounter (HOSPITAL_COMMUNITY): Payer: Self-pay | Admitting: *Deleted

## 2014-01-11 DIAGNOSIS — N433 Hydrocele, unspecified: Secondary | ICD-10-CM | POA: Diagnosis not present

## 2014-01-11 DIAGNOSIS — Z87891 Personal history of nicotine dependence: Secondary | ICD-10-CM | POA: Insufficient documentation

## 2014-01-11 DIAGNOSIS — G4730 Sleep apnea, unspecified: Secondary | ICD-10-CM | POA: Insufficient documentation

## 2014-01-11 HISTORY — DX: Obstructive sleep apnea (adult) (pediatric): G47.33

## 2014-01-11 HISTORY — DX: Personal history of colon polyps, unspecified: Z86.0100

## 2014-01-11 HISTORY — DX: Type 2 diabetes mellitus without complications: E11.9

## 2014-01-11 HISTORY — DX: Personal history of colonic polyps: Z86.010

## 2014-01-11 HISTORY — DX: Frequency of micturition: R35.0

## 2014-01-11 HISTORY — PX: HYDROCELE EXCISION: SHX482

## 2014-01-11 HISTORY — DX: Urgency of urination: R39.15

## 2014-01-11 HISTORY — DX: Hydrocele, unspecified: N43.3

## 2014-01-11 HISTORY — DX: Nocturia: R35.1

## 2014-01-11 HISTORY — DX: Unspecified osteoarthritis, unspecified site: M19.90

## 2014-01-11 HISTORY — DX: Personal history of Methicillin resistant Staphylococcus aureus infection: Z86.14

## 2014-01-11 HISTORY — DX: Hyperlipidemia, unspecified: E78.5

## 2014-01-11 LAB — POCT I-STAT 4, (NA,K, GLUC, HGB,HCT)
Glucose, Bld: 124 mg/dL — ABNORMAL HIGH (ref 70–99)
HCT: 41 % (ref 39.0–52.0)
HEMOGLOBIN: 13.9 g/dL (ref 13.0–17.0)
Potassium: 4.1 mEq/L (ref 3.7–5.3)
Sodium: 142 mEq/L (ref 137–147)

## 2014-01-11 LAB — GLUCOSE, CAPILLARY: Glucose-Capillary: 134 mg/dL — ABNORMAL HIGH (ref 70–99)

## 2014-01-11 SURGERY — HYDROCELECTOMY
Anesthesia: General | Site: Scrotum | Laterality: Right

## 2014-01-11 MED ORDER — ACETAMINOPHEN 325 MG PO TABS: 650.0000 mg | ORAL_TABLET | ORAL | Status: DC | PRN

## 2014-01-11 MED ORDER — FENTANYL CITRATE 0.05 MG/ML IJ SOLN
INTRAMUSCULAR | Status: AC
  Filled 2014-01-11: qty 4

## 2014-01-11 MED ORDER — FENTANYL CITRATE 0.05 MG/ML IJ SOLN: 25.0000 ug | INTRAMUSCULAR | Status: DC | PRN

## 2014-01-11 MED ORDER — FENTANYL CITRATE 0.05 MG/ML IJ SOLN
INTRAMUSCULAR | Status: DC | PRN
  Administered 2014-01-11: 100 ug via INTRAVENOUS

## 2014-01-11 MED ORDER — LACTATED RINGERS IV SOLN: INTRAVENOUS | Status: DC

## 2014-01-11 MED ORDER — CEFAZOLIN SODIUM-DEXTROSE 2-3 GM-% IV SOLR
2.0000 g | INTRAVENOUS | Status: AC
  Administered 2014-01-11: 2 g via INTRAVENOUS

## 2014-01-11 MED ORDER — LIDOCAINE HCL (CARDIAC) 20 MG/ML IV SOLN
INTRAVENOUS | Status: DC | PRN
  Administered 2014-01-11: 100 mg via INTRAVENOUS

## 2014-01-11 MED ORDER — SODIUM CHLORIDE 0.9 % IV SOLN: 250.0000 mL | INTRAVENOUS | Status: DC | PRN

## 2014-01-11 MED ORDER — BUPIVACAINE HCL (PF) 0.25 % IJ SOLN
INTRAMUSCULAR | Status: DC | PRN
  Administered 2014-01-11: 6 mL

## 2014-01-11 MED ORDER — CEFAZOLIN SODIUM-DEXTROSE 2-3 GM-% IV SOLR
INTRAVENOUS | Status: AC
  Filled 2014-01-11: qty 50

## 2014-01-11 MED ORDER — OXYCODONE HCL 5 MG PO TABS: 5.0000 mg | ORAL_TABLET | ORAL | Status: DC | PRN

## 2014-01-11 MED ORDER — ONDANSETRON HCL 4 MG/2ML IJ SOLN
INTRAMUSCULAR | Status: DC | PRN
  Administered 2014-01-11: 4 mg via INTRAVENOUS

## 2014-01-11 MED ORDER — PROPOFOL INFUSION 10 MG/ML OPTIME
INTRAVENOUS | Status: DC | PRN
  Administered 2014-01-11: 200 mL via INTRAVENOUS
  Administered 2014-01-11: 50 mL via INTRAVENOUS

## 2014-01-11 MED ORDER — MIDAZOLAM HCL 5 MG/5ML IJ SOLN
INTRAMUSCULAR | Status: DC | PRN
  Administered 2014-01-11: 2 mg via INTRAVENOUS

## 2014-01-11 MED ORDER — LIDOCAINE HCL (PF) 1 % IJ SOLN
INTRAMUSCULAR | Status: DC | PRN
  Administered 2014-01-11: 6 mL

## 2014-01-11 MED ORDER — LACTATED RINGERS IV SOLN
INTRAVENOUS | Status: DC
  Administered 2014-01-11 (×2): via INTRAVENOUS

## 2014-01-11 MED ORDER — MIDAZOLAM HCL 2 MG/2ML IJ SOLN
INTRAMUSCULAR | Status: AC
  Filled 2014-01-11: qty 2

## 2014-01-11 MED ORDER — DEXAMETHASONE SODIUM PHOSPHATE 10 MG/ML IJ SOLN
INTRAMUSCULAR | Status: DC | PRN
  Administered 2014-01-11: 10 mg via INTRAVENOUS

## 2014-01-11 MED ORDER — SODIUM CHLORIDE 0.9 % IJ SOLN: 3.0000 mL | Freq: Two times a day (BID) | INTRAMUSCULAR | Status: DC

## 2014-01-11 MED ORDER — SODIUM CHLORIDE 0.9 % IJ SOLN: 3.0000 mL | INTRAMUSCULAR | Status: DC | PRN

## 2014-01-11 MED ORDER — HYDROCODONE-ACETAMINOPHEN 5-325 MG PO TABS: 1.0000 | ORAL_TABLET | Freq: Four times a day (QID) | ORAL | Status: DC | PRN

## 2014-01-11 MED ORDER — SODIUM CHLORIDE 0.9 % IR SOLN
Status: DC | PRN
  Administered 2014-01-11: 500 mL

## 2014-01-11 MED ORDER — ACETAMINOPHEN 650 MG RE SUPP
650.0000 mg | RECTAL | Status: DC | PRN
  Filled 2014-01-11: qty 1

## 2014-01-11 SURGICAL SUPPLY — 35 items
BLADE CLIPPER SURG (BLADE) ×2 IMPLANT
BLADE SURG 15 STRL LF DISP TIS (BLADE) ×1 IMPLANT
BLADE SURG 15 STRL SS (BLADE) ×1
BNDG GAUZE ELAST 4 BULKY (GAUZE/BANDAGES/DRESSINGS) ×2 IMPLANT
CANISTER SUCTION 1200CC (MISCELLANEOUS) IMPLANT
CANISTER SUCTION 2500CC (MISCELLANEOUS) ×2 IMPLANT
CLEANER CAUTERY TIP 5X5 PAD (MISCELLANEOUS) ×1 IMPLANT
COVER MAYO STAND STRL (DRAPES) ×2 IMPLANT
COVER TABLE BACK 60X90 (DRAPES) ×2 IMPLANT
DISSECTOR ROUND CHERRY 3/8 STR (MISCELLANEOUS) IMPLANT
DRAIN PENROSE 18X1/4 LTX STRL (WOUND CARE) ×2 IMPLANT
DRAPE PED LAPAROTOMY (DRAPES) ×2 IMPLANT
ELECT REM PT RETURN 9FT ADLT (ELECTROSURGICAL) ×2
ELECTRODE REM PT RTRN 9FT ADLT (ELECTROSURGICAL) ×1 IMPLANT
GLOVE BIO SURGEON STRL SZ 6.5 (GLOVE) ×2 IMPLANT
GLOVE BIOGEL PI IND STRL 6.5 (GLOVE) ×1 IMPLANT
GLOVE BIOGEL PI INDICATOR 6.5 (GLOVE) ×1
GLOVE SURG SS PI 8.0 STRL IVOR (GLOVE) ×2 IMPLANT
GOWN STRL REUS W/ TWL LRG LVL3 (GOWN DISPOSABLE) ×1 IMPLANT
GOWN STRL REUS W/ TWL XL LVL3 (GOWN DISPOSABLE) ×1 IMPLANT
GOWN STRL REUS W/TWL LRG LVL3 (GOWN DISPOSABLE) ×1
GOWN STRL REUS W/TWL XL LVL3 (GOWN DISPOSABLE) ×1
NEEDLE HYPO 25X1 1.5 SAFETY (NEEDLE) ×2 IMPLANT
NS IRRIG 500ML POUR BTL (IV SOLUTION) ×2 IMPLANT
PACK BASIN DAY SURGERY FS (CUSTOM PROCEDURE TRAY) ×2 IMPLANT
PAD CLEANER CAUTERY TIP 5X5 (MISCELLANEOUS) ×1
PENCIL BUTTON HOLSTER BLD 10FT (ELECTRODE) ×2 IMPLANT
SPONGE GAUZE 4X4 12PLY STER LF (GAUZE/BANDAGES/DRESSINGS) ×2 IMPLANT
SUPPORT SCROTAL LG STRP (MISCELLANEOUS) ×2 IMPLANT
SUT CHROMIC 3 0 SH 27 (SUTURE) ×4 IMPLANT
SYRINGE CONTROL L 12CC (SYRINGE) ×2 IMPLANT
TOWEL OR 17X24 6PK STRL BLUE (TOWEL DISPOSABLE) ×4 IMPLANT
TRAY DSU PREP LF (CUSTOM PROCEDURE TRAY) ×2 IMPLANT
TUBE CONNECTING 12X1/4 (SUCTIONS) ×2 IMPLANT
YANKAUER SUCT BULB TIP NO VENT (SUCTIONS) ×2 IMPLANT

## 2014-01-11 NOTE — Anesthesia Procedure Notes (Signed)
Procedure Name: LMA Insertion Date/Time: 01/11/2014 7:36 AM Performed by: Wanita Chamberlain Pre-anesthesia Checklist: Patient identified, Timeout performed, Emergency Drugs available, Suction available and Patient being monitored Patient Re-evaluated:Patient Re-evaluated prior to inductionOxygen Delivery Method: Circle system utilized Preoxygenation: Pre-oxygenation with 100% oxygen Intubation Type: IV induction Ventilation: Mask ventilation without difficulty LMA: LMA inserted LMA Size: 5.0 Number of attempts: 2 Airway Equipment and Method: Bite block Placement Confirmation: breath sounds checked- equal and bilateral and positive ETCO2 Tube secured with: Tape Dental Injury: Teeth and Oropharynx as per pre-operative assessment  Comments: Attempted #4 LMA unable to get good seal. #5 inserted with ease +ETCO2

## 2014-01-11 NOTE — Anesthesia Postprocedure Evaluation (Signed)
  Anesthesia Post-op Note  Patient: Michael Odom  Procedure(s) Performed: Procedure(s) (LRB): RIGHT HYDROCELECTOMY ADULT (Right)  Patient Location: PACU  Anesthesia Type: General  Level of Consciousness: awake and alert   Airway and Oxygen Therapy: Patient Spontanous Breathing  Post-op Pain: mild  Post-op Assessment: Post-op Vital signs reviewed, Patient's Cardiovascular Status Stable, Respiratory Function Stable, Patent Airway and No signs of Nausea or vomiting  Last Vitals:  Filed Vitals:   01/11/14 0900  BP: 136/77  Pulse: 63  Temp:   Resp: 20    Post-op Vital Signs: stable   Complications: No apparent anesthesia complications

## 2014-01-11 NOTE — Transfer of Care (Signed)
Immediate Anesthesia Transfer of Care Note  Patient: Michael Odom  Procedure(s) Performed: Procedure(s): RIGHT HYDROCELECTOMY ADULT (Right)  Patient Location: PACU  Anesthesia Type:General  Level of Consciousness: sedated and patient cooperative  Airway & Oxygen Therapy: Patient Spontanous Breathing and Patient connected to nasal cannula oxygen  Post-op Assessment: Report given to PACU RN and Post -op Vital signs reviewed and stable  Post vital signs: Reviewed and stable  Complications: No apparent anesthesia complications

## 2014-01-11 NOTE — Discharge Instructions (Addendum)
Hydrocele, Adult Fluid can collect around the testicles. This fluid forms in a sac. This condition is called a hydrocele. The collected fluid causes swelling of the scrotum. Usually, it affects just one testicle. Most of the time, the condition does not cause pain. Sometimes, the hydrocele goes away on its own. Other times, surgery is needed to get rid of the fluid. CAUSES A hydrocele does not develop often. Different things can cause a hydrocele in a man, including:  Injury to the scrotum.  Infection.  X-ray of the area around the scrotum.  A tumor or cancer of the testicle.  Twisting of a testicle.  Decreased blood flow to the scrotum. SYMPTOMS   Swelling without pain. The hydrocele feels like a water-filled balloon.  Swelling with pain. This can occur if the hydrocele was caused by infection or twisting.  Mild discomfort in the scrotum.  The hydrocele may feel heavy.  Swelling that gets smaller when you lie down. DIAGNOSIS  Your caregiver will do a physical exam to decide if you have a hydrocele. This may include:  Asking questions about your overall health, today and in the past. Your caregiver may ask about any injuries, X-rays, or infections.  Pushing on your abdomen or asking you to change positions to see if the size of the hydrocele changes.  Shining a light through the scrotum (transillumination) to see if the fluid inside the scrotum is clear.  Blood tests and urine tests to check for infection.  Imaging studies that take pictures of the scrotum and testicles. TREATMENT  Treatment depends in part on what caused the condition. Options include:  Watchful waiting. Your caregiver checks the hydrocele every so often.  Different surgeries to drain the fluid.  A needle may be put into the scrotum to drain fluid (needle aspiration). Fluid often returns after this type of treatment.  A cut (incision) may be made in the scrotum to remove the fluid sac  (hydrocelectomy).  An incision may be made in the groin to repair a hydrocele that has contact with abdominal fluids (communicating hydrocele).  Medicines to treat an infection (antibiotics). HOME CARE INSTRUCTIONS  What you need to do at home may depend on the cause of the hydrocele and type of treatment. In general:  Take all medicine as directed by your caregiver. Follow the directions carefully.  Ask your caregiver if there is anything you should not do while you recover (activities, lifting, work, sex).  If you had surgery to repair a communicating hydrocele, recovery time may vary. Ask you caregiver about your recovery time.  Avoid heavy lifting for 4 to 6 weeks.  If you had an incision on the scrotum or groin, wash it for 2 to 3 days after surgery. Do this as long as the skin is closed and there are no gaps in the wound. Wash gently, and avoid rubbing the incision.  Keep all follow-up appointments. SEEK MEDICAL CARE IF:   Your scrotum seems to be getting larger.  The area becomes more and more uncomfortable. SEEK IMMEDIATE MEDICAL CARE IF:  You have a fever. Document Released: 08/15/2009 Document Revised: 12/16/2012 Document Reviewed: 08/15/2009 [facility] Patient Information 2015 [place], [place]. This information is not intended to replace advice given to you by your health care provider. Make sure you discuss any questions you have with your health care provider.  Remove the drain in the morning.  It should just slide out and if it doesn't please contact the office.   [place]  Care Instructions  Activity: Get plenty of rest for the remainder of the day. A responsible adult should stay with you for 24 hours following the procedure.  For the next 24 hours, DO NOT: -Drive a car -Paediatric nurse -Drink alcoholic beverages -Take any medication unless instructed by your physician -Make any legal decisions or sign important papers.  Meals: Start with liquid  foods such as gelatin or soup. Progress to regular foods as tolerated. Avoid greasy, spicy, heavy foods. If nausea and/or vomiting occur, drink only clear liquids until the nausea and/or vomiting subsides. Call your physician if vomiting continues.  Special Instructions/Symptoms: Your throat may feel dry or sore from the anesthesia or the breathing tube placed in your throat during surgery. If this causes discomfort, gargle with warm salt water. The discomfort should disappear within 24 hours.

## 2014-01-11 NOTE — Addendum Note (Signed)
Addendum  created 01/11/14 0945 by Wanita Chamberlain, CRNA   Modules edited: Anesthesia Flowsheet

## 2014-01-11 NOTE — Brief Op Note (Signed)
01/11/2014  8:14 AM  PATIENT:  Michael Odom  63 y.o. male  PRE-OPERATIVE DIAGNOSIS:  RIGHT HYDROCELE   POST-OPERATIVE DIAGNOSIS:  RIGHT HYDROCELE   PROCEDURE:  Procedure(s): RIGHT HYDROCELECTOMY ADULT (Right)  SURGEON:  Surgeon(s) and Role:    * Malka So, MD - Primary  PHYSICIAN ASSISTANT:   ASSISTANTS: none   ANESTHESIA:   general  EBL:  Total I/O In: -  Out: 425 [Other:425]  BLOOD ADMINISTERED:none  DRAINS: Penrose drain in the right scrotum   LOCAL MEDICATIONS USED:  MARCAINE   , LIDOCAINE  and Amount: 12 ml  SPECIMEN:  No Specimen  DISPOSITION OF SPECIMEN:  N/A  COUNTS:  YES  TOURNIQUET:  * No tourniquets in log *  DICTATION: .Other Dictation: Dictation Number [id number]  PLAN OF CARE: Discharge to home after PACU  PATIENT DISPOSITION:  PACU - hemodynamically stable.   Delay start of Pharmacological VTE agent (>24hrs) due to surgical blood loss or risk of bleeding: not applicable

## 2014-01-11 NOTE — Interval H&P Note (Signed)
History and Physical Interval Note:  01/11/2014 7:23 AM  Michael Odom  has presented today for surgery, with the diagnosis of RIGHT HYDROCELE   The various methods of treatment have been discussed with the patient and family. After consideration of risks, benefits and other options for treatment, the patient has consented to  Procedure(s): RIGHT HYDROCELECTOMY ADULT (Right) as a surgical intervention .  The patient's history has been reviewed, patient examined, no change in status, stable for surgery.  I have reviewed the patient's chart and labs.  Questions were answered to the patient's satisfaction.     Tiphani Mells J

## 2014-01-11 NOTE — Op Note (Signed)
Michael Odom, Michael Odom                ACCOUNT NO.:  [id number]  MEDICAL RECORD NO.:  [id number]  LOCATION:  [facility]                         FACILITY:  [facility]  PHYSICIAN:  Marshall Cork. Jeffie Pollock, M.D.    DATE OF BIRTH:  04/22/1950  DATE OF PROCEDURE:  01/11/2014 DATE OF DISCHARGE:                              OPERATIVE REPORT   PROCEDURE:  Right hydrocelectomy.  PREOPERATIVE DIAGNOSIS:  Right hydrocele.  POSTOPERATIVE DIAGNOSIS:  Right hydrocele.  SURGEON:  Marshall Cork. Jeffie Pollock, MD  ANESTHESIA:  General.  SPECIMEN:  None.  DRAINS:  Quarter-inch Penrose.  BLOOD LOSS:  Minimal.  COMPLICATIONS:  None.  INDICATIONS:  Michael Odom is a 63 year old white male with a large right hydrocele who has elected repair.  FINDINGS OF PROCEDURE:  He was taken to the operating room where he was given 2 g of Ancef.  General anesthetic was induced with the patient in the supine position.  He was fitted with PAS hose.  His scrotum was clipped.  He was prepped with Betadine solution and draped in usual sterile fashion.  A right anterior scrotal incision was made along the skin lines obliquely approximately 5 cm in length with a knife.  This was carried down through the dartos with the Bovie.  The hydrocele sac was identified but there was a thick inflammatory rind around it that required some blunt dissection to separate it from the hydrocele sac from the rind.  I was unable to free it completely and deliver the testicle, so once I had about 3/4 of the wall free from the scrotum, opened the hydrocele, 425 mL of hydrocele fluid was drained.  The testicle within the sac were then delivered through the incision.  The redundant sac was excised.  Hemostasis was achieved.  The sac was then imbricated behind the testicle in a water bottle fashion using a running locked 3-0 chromic.  The cord was infiltrated with approximately 5 mL of 50:50 mixture of 0.25% Marcaine and 1% lidocaine without epinephrine. An additional 7  mL had been infiltrated in the anterior scrotal wall during the initial incision.  Once the cord block had been performed, inspection revealed no active bleeding.  A quarter-inch Penrose drain was placed through a separate stab wound in the dependent portion of the right scrotum and secured to the skin with a towel clip.  The dartos was closed using a running 3-0 chromic suture with great care being taken to avoid entrapping the drain.  The skin was then closed with interrupted vertical mattress 3-0 chromic sutures.  The drain was trimmed.  The towel clip was removed.  A dressing of 4x4s, fluff Kerlix, followed by scrotal support was applied.  The patient's anesthetic was reversed.  He was moved to recovery room in stable condition.  There were no complications.     Marshall Cork. Jeffie Pollock, M.D.     JJW/MEDQ  D:  01/11/2014  T:  01/11/2014  Job:  [id number]

## 2014-01-12 ENCOUNTER — Encounter (HOSPITAL_BASED_OUTPATIENT_CLINIC_OR_DEPARTMENT_OTHER): Payer: Self-pay | Admitting: Urology

## 2014-01-26 ENCOUNTER — Encounter (INDEPENDENT_AMBULATORY_CARE_PROVIDER_SITE_OTHER): Payer: Self-pay | Admitting: *Deleted

## 2014-01-28 ENCOUNTER — Ambulatory Visit (INDEPENDENT_AMBULATORY_CARE_PROVIDER_SITE_OTHER): Payer: Self-pay | Admitting: Urology

## 2014-01-28 DIAGNOSIS — N433 Hydrocele, unspecified: Secondary | ICD-10-CM

## 2014-10-16 IMAGING — US US SCROTUM
1 series · 14 of 25 positions shown · non-contrast
Comparison: None.

CLINICAL DATA: Scrotal swelling.

EXAM:
SCROTAL ULTRASOUND
DOPPLER ULTRASOUND OF THE TESTICLES
TECHNIQUE: Complete ultrasound examination of the testicles, epididymis, and
other scrotal structures was performed. Color and spectral Doppler
ultrasound were also utilized to evaluate blood flow to the
testicles.

[Series 1: us scrotum · 0.05mm/px · 14 of 44 slices shown]
[im 1/44]
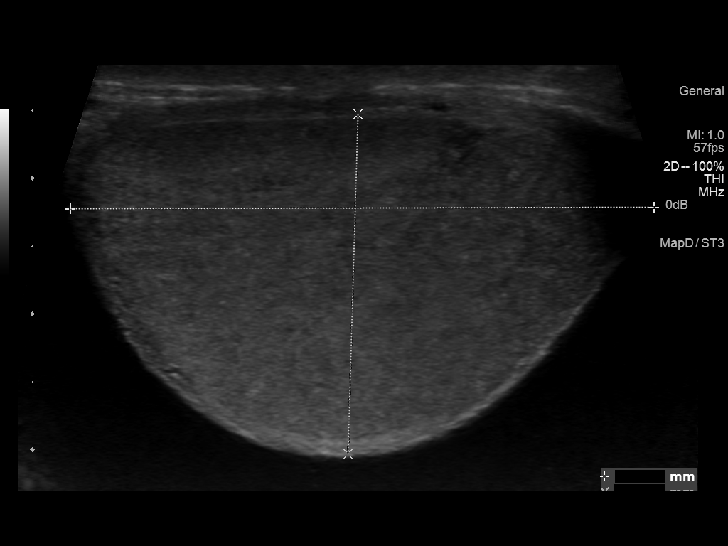
[im 4/44]
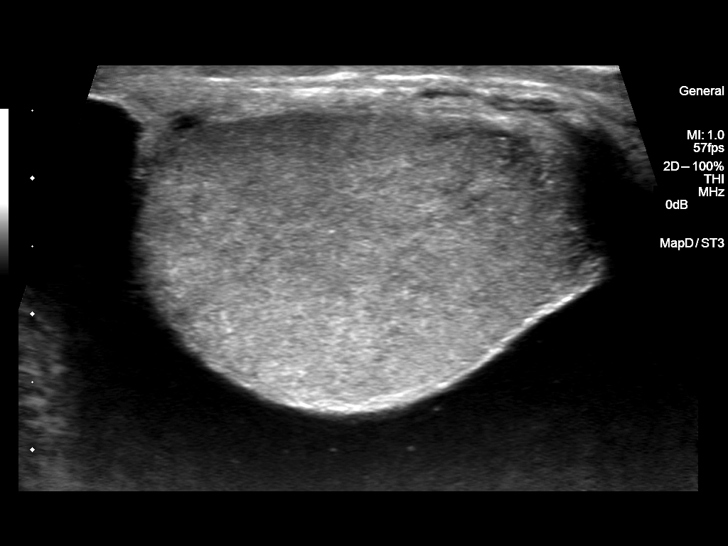
[im 8/44]
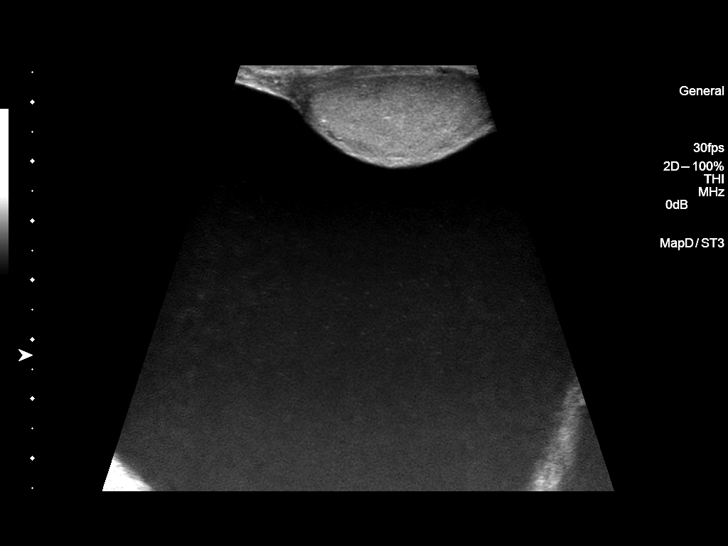
[im 11/44]
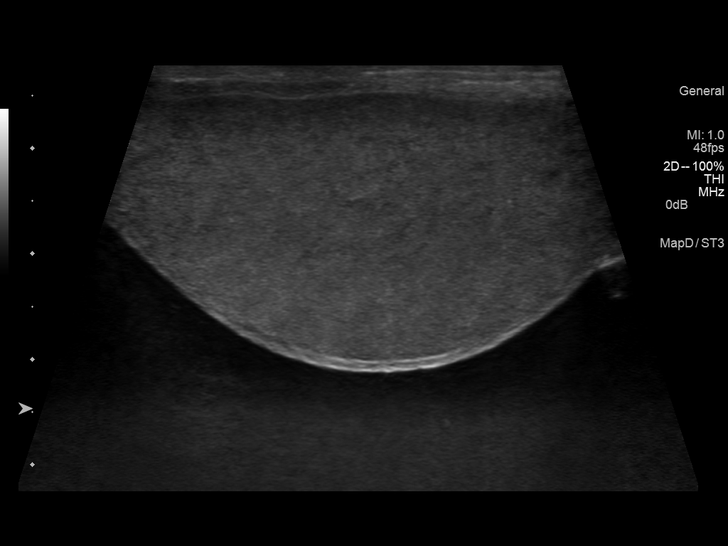
[im 15/44]
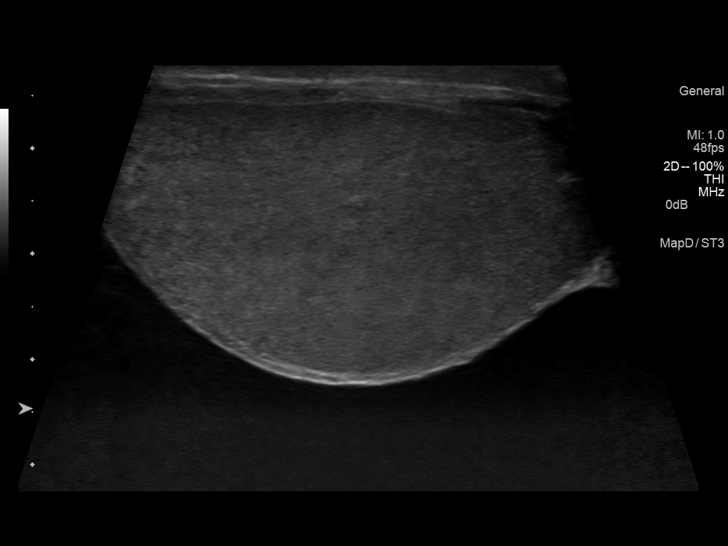
[im 17/44]
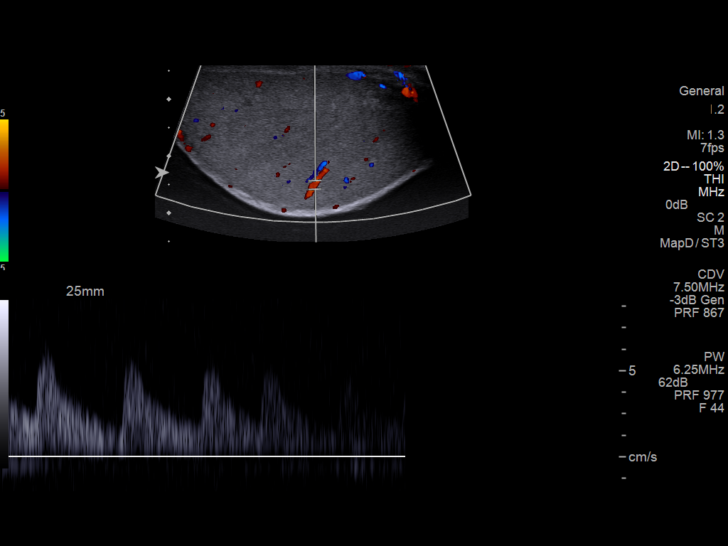
[im 20/44]
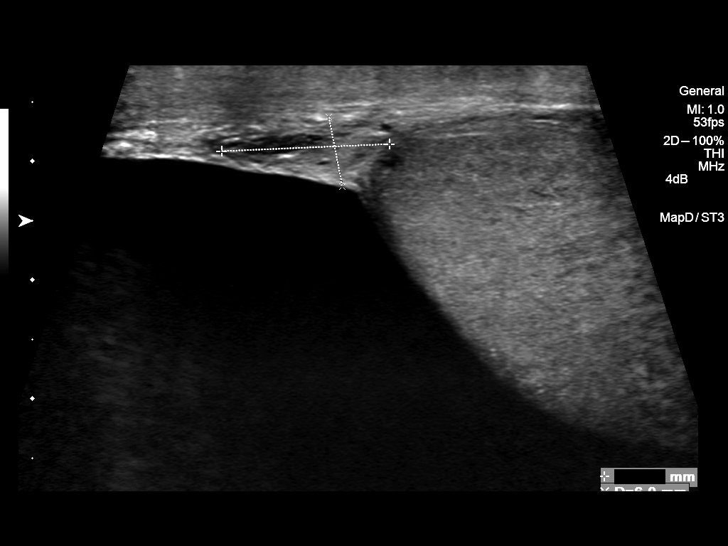
[im 24/44]
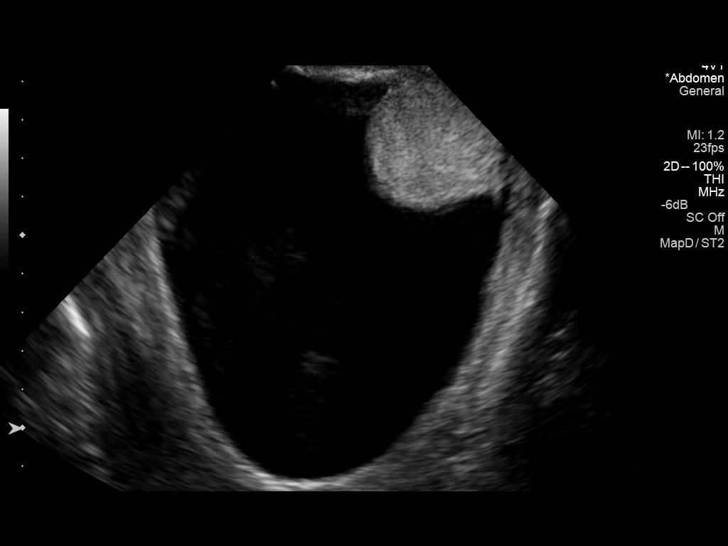
[im 27/44]
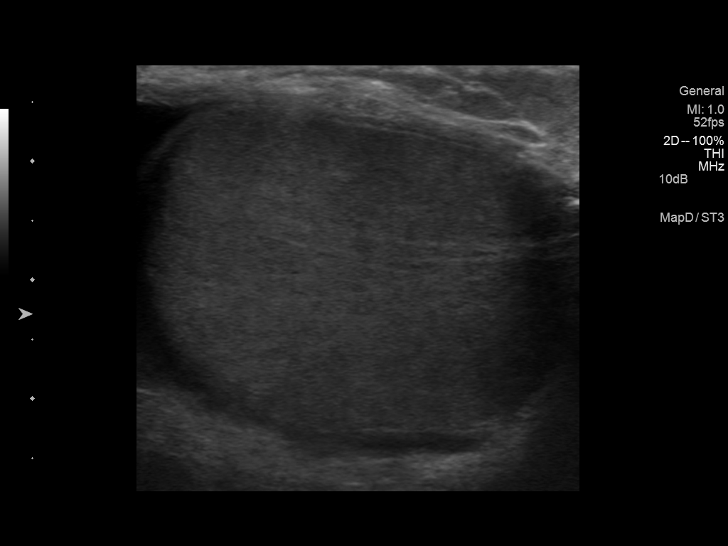
[im 29/44]
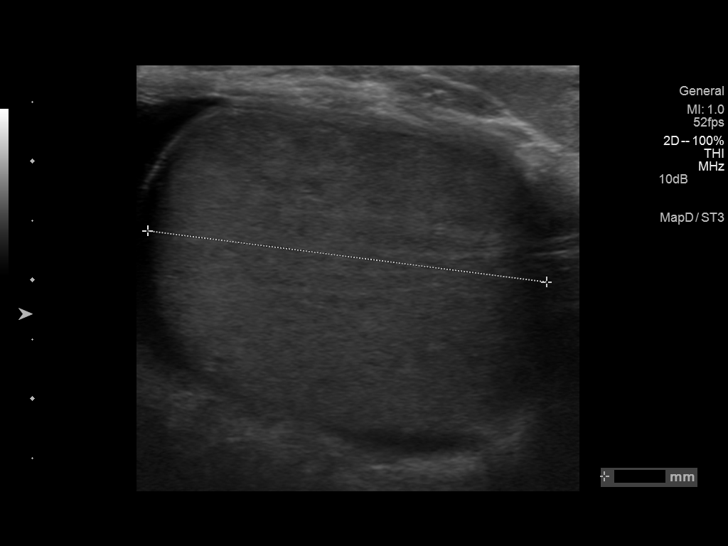
[im 33/44]
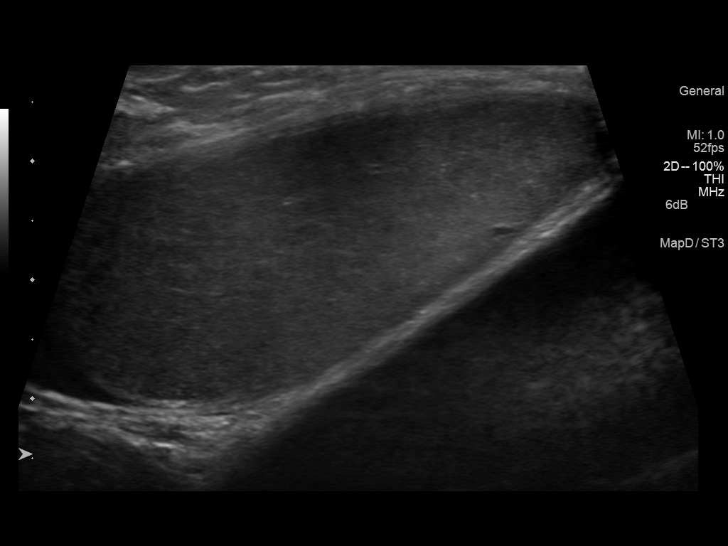
[im 36/44]
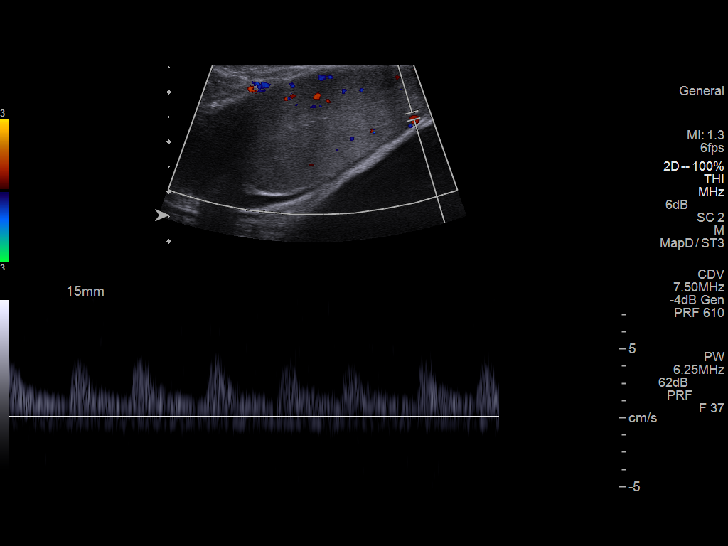
[im 40/44]
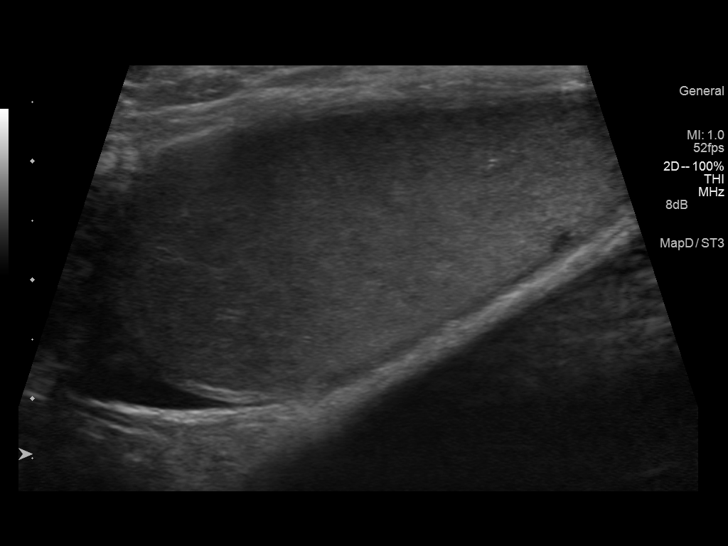
[im 44/44]
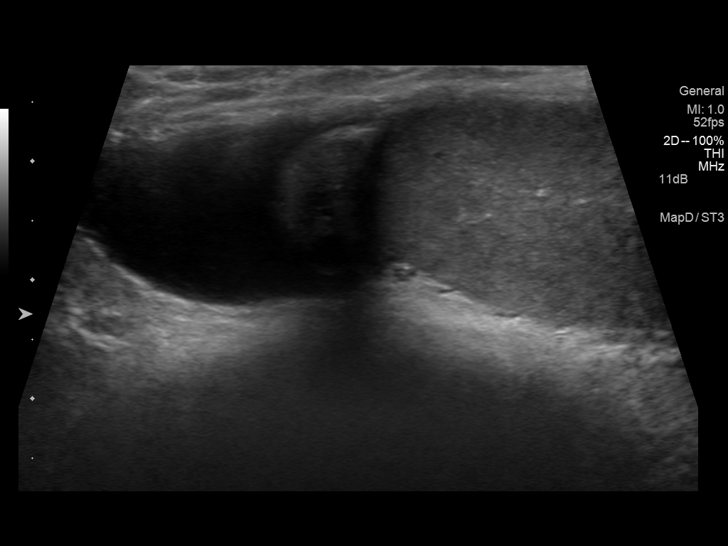

[14 of 25 positions shown; findings below may reference images not displayed]

FINDINGS: Right testicle

Measurements: 4.3 x 2.5 x 5.2 cm. No mass or microlithiasis
visualized.

Left testicle

Measurements: 5.1 x 2.0 x 3.4 cm. No mass or microlithiasis
visualized.

Right epididymis:  Normal in size and appearance.

Left epididymis:  Normal in size and appearance.

Hydrocele: Moderate to large right-sided hydrocele. Trace left-sided
scrotal fluid which is likely physiologic.

Varicocele:  Absent

Pulsed Doppler interrogation of both testes demonstrates low
resistance arterial and venous waveforms bilaterally.

Normal color Doppler signal within bilateral testicles.
IMPRESSION: 1. No evidence of testicular torsion.
2. Moderate to large right-sided hydrocele.

## 2014-12-17 IMAGING — US US EXTREM UP DUPLEX ARTERIAL*R* LIMITED
1 series · 14 of 17 positions shown · non-contrast
Comparison: None

CLINICAL DATA: Mass at RIGHT-side of neck at supraclavicular
region, pulsatile, question aneurysm versus tortuous vessel

EXAM:
RIGHT UPPER EXTREMITY ARTERIAL DUPLEX SCAN
TECHNIQUE: Gray-scale sonography as well as color Doppler and duplex ultrasound
was performed to evaluate the arteries of the upper extremity.

[Series 1: us extrem up duplex arterial*right* limited · 0.04mm/px · 14 of 17 slices shown]
[im 1/17]
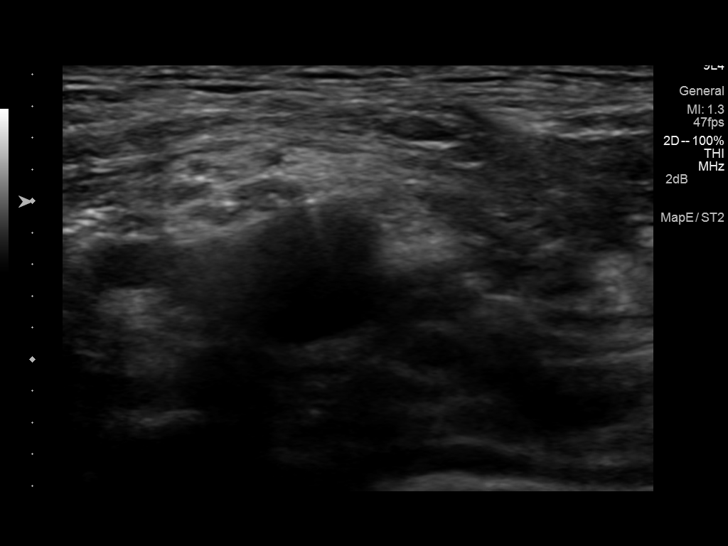
[im 2/17]
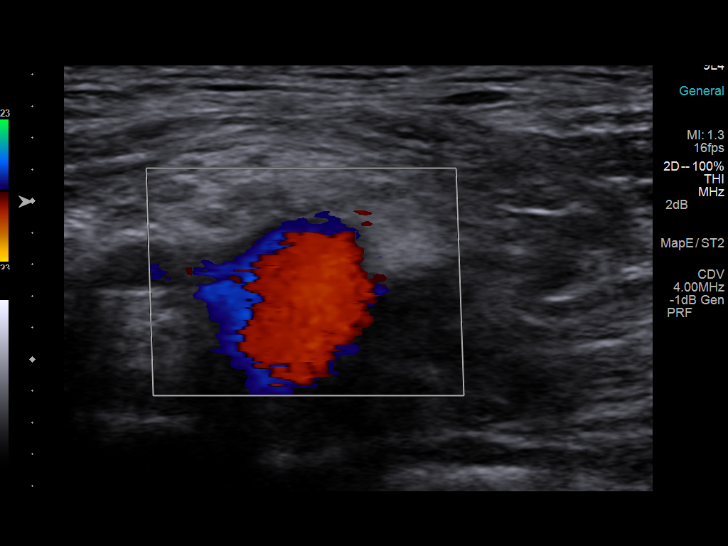
[im 4/17]
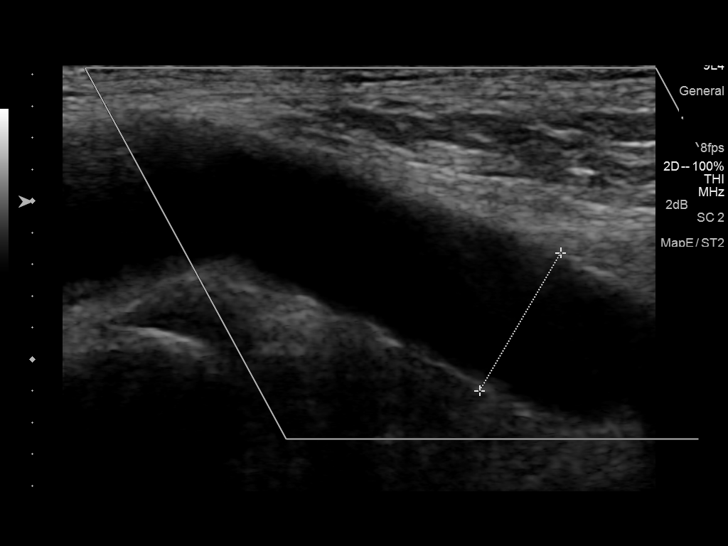
[im 5/17]
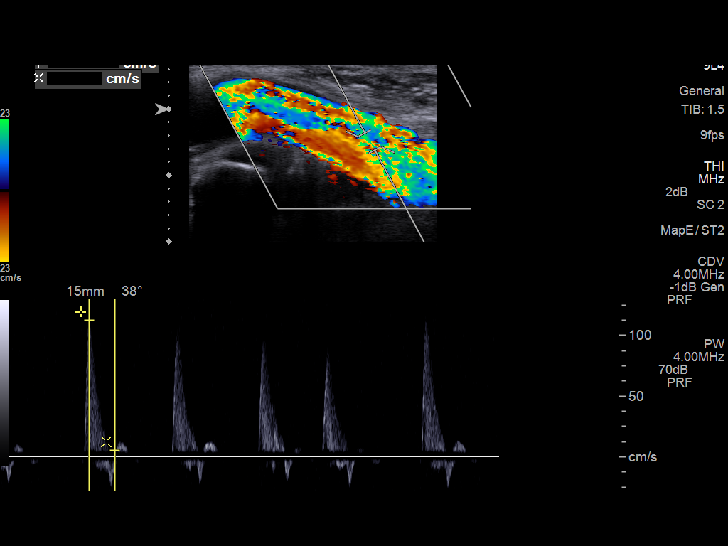
[im 6/17]
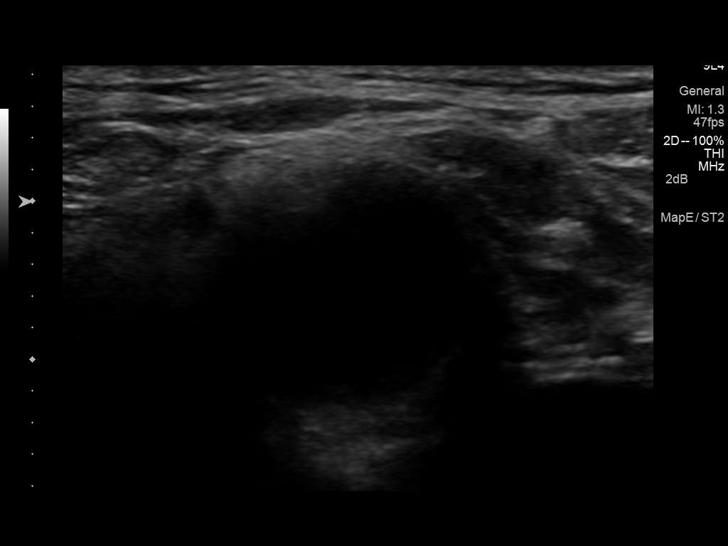
[im 7/17]
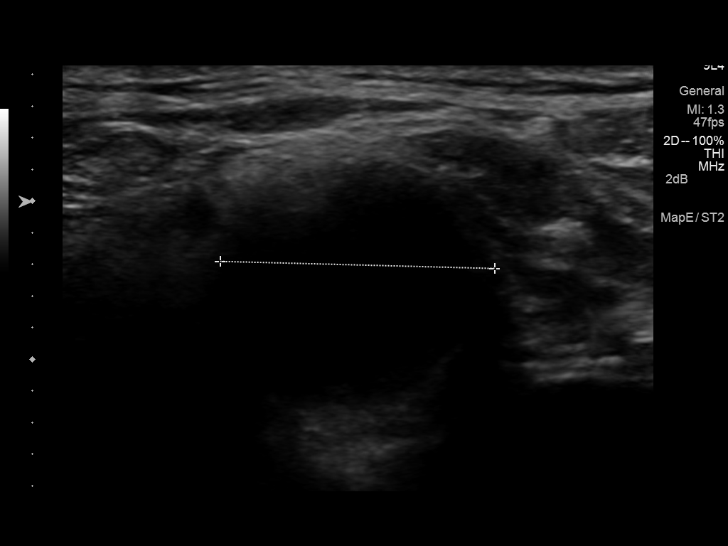
[im 8/17]
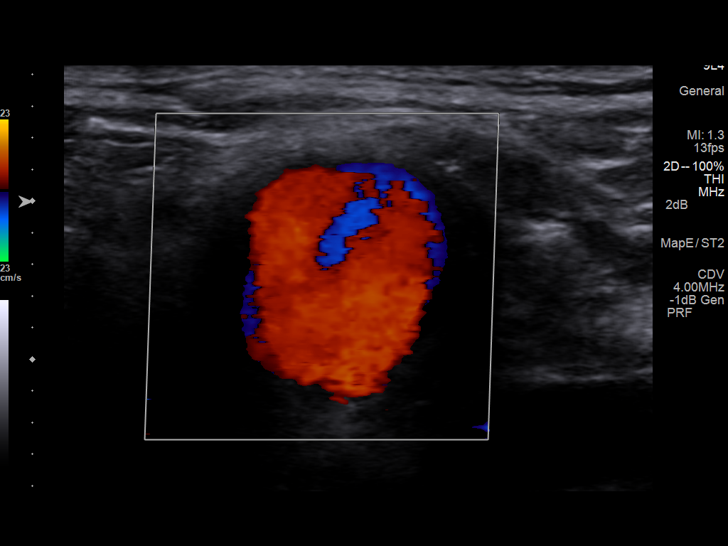
[im 10/17]
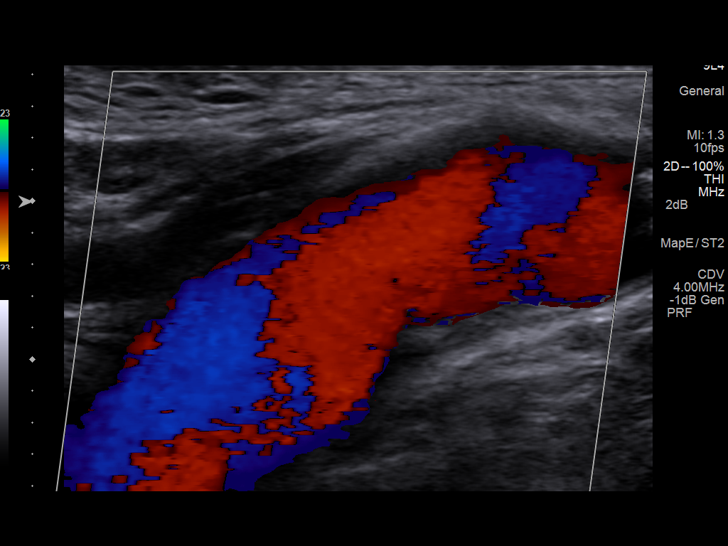
[im 11/17]
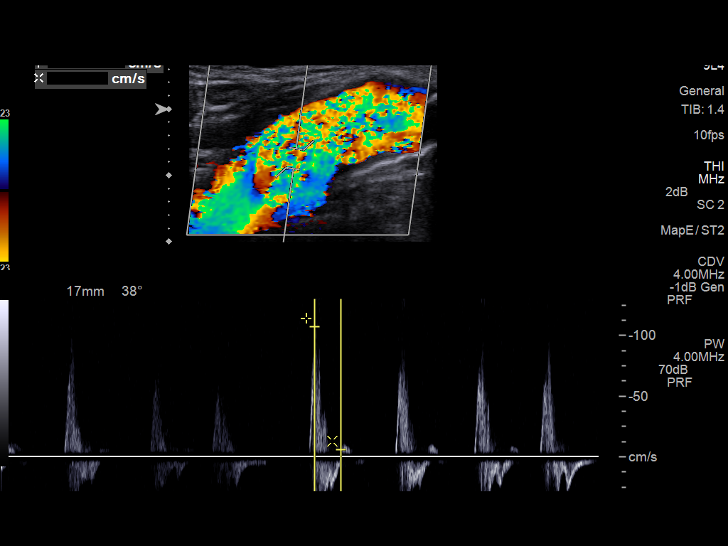
[im 12/17]
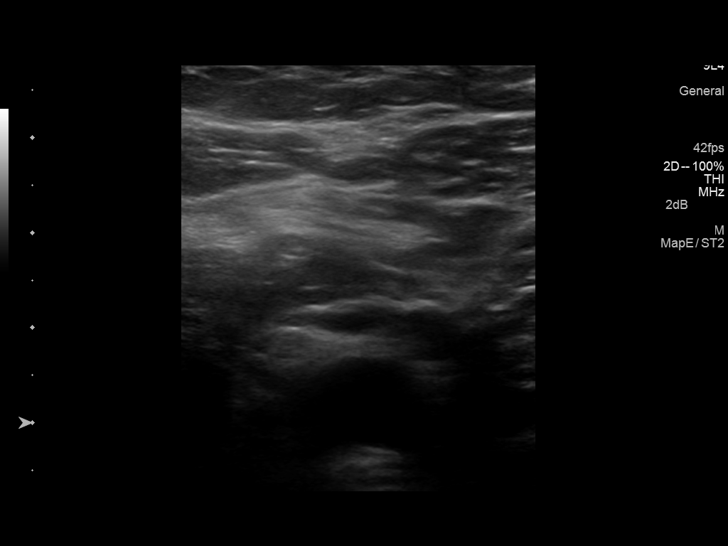
[im 13/17]
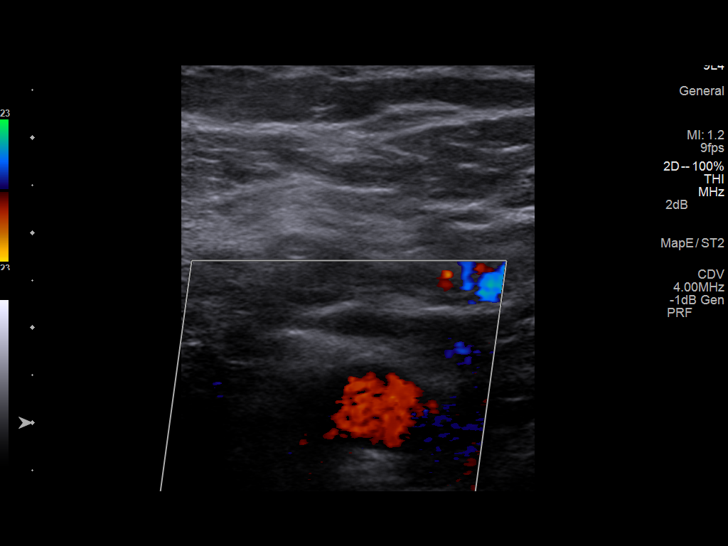
[im 14/17]
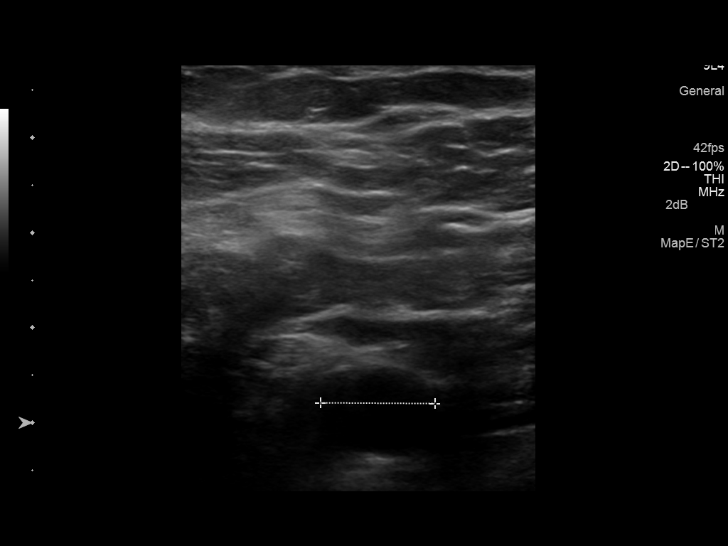
[im 16/17]
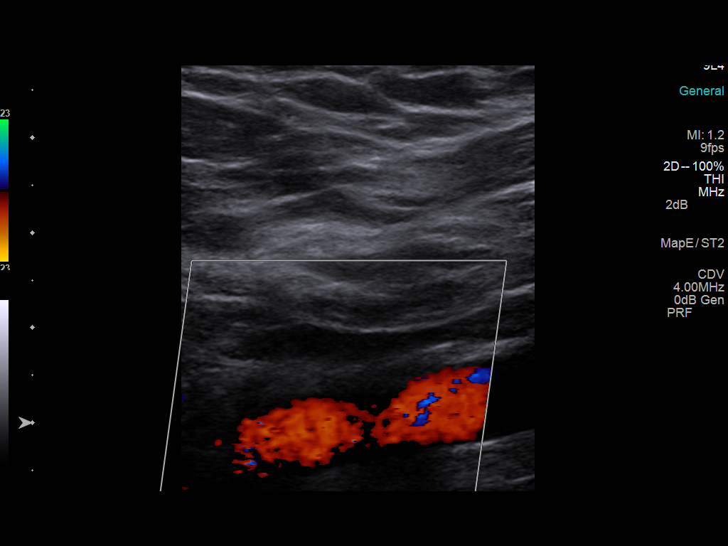
[im 17/17]
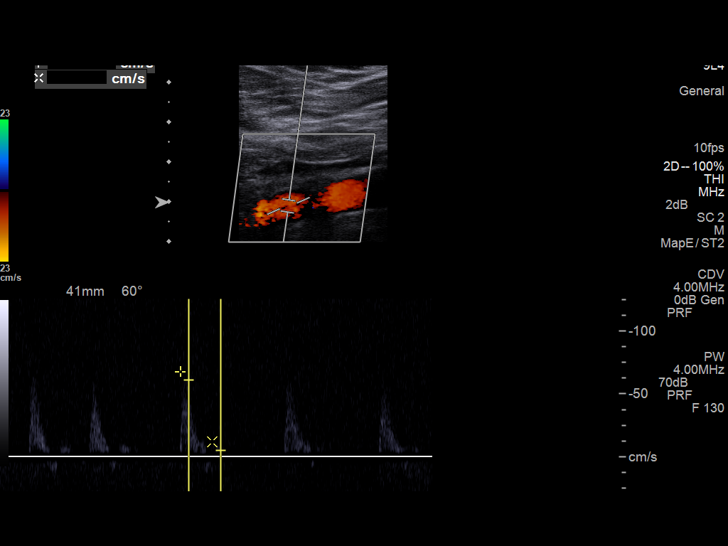

[14 of 17 positions shown; findings below may reference images not displayed]

FINDINGS: At the site of clinical concern, focal aneurysmal dilatation of the
RIGHT subclavian artery is identified.

Proximally the subclavian artery measures 10 mm diameter.

At the site of clinical concern subclavian artery distends to 17 mm
diameter decreasing to 12 mm diameter more distally.

Turbulent blood flow seen within the dilated segment.

Minimal noncalcified plaque at site.

No soft tissue mass or adenopathy seen at region.
IMPRESSION: Tortuous RIGHT subclavian artery with a segment of fusiform
aneurysmal dilatation up to 17 mm diameter.

Consider followup imaging in 6-12 months to demonstrate stability.

## 2015-09-20 LAB — FECAL OCCULT BLOOD, GUAIAC: Fecal Occult Blood: NEGATIVE

## 2016-02-13 ENCOUNTER — Encounter: Payer: Self-pay | Admitting: Family Medicine

## 2017-04-29 LAB — MICROALBUMIN, URINE: Microalb, Ur: 0.2

## 2018-06-30 LAB — CBC AND DIFFERENTIAL
HCT: 40 — AB (ref 41–53)
Hemoglobin: 13.6 (ref 13.5–17.5)
Neutrophils Absolute: 4761
Platelets: 223 (ref 150–399)
WBC: 6.9

## 2018-06-30 LAB — BASIC METABOLIC PANEL
BUN: 21 (ref 4–21)
CO2: 28 — AB (ref 13–22)
Chloride: 105 (ref 99–108)
Creatinine: 1.2 (ref <=1.3)
Glucose: 142
Potassium: 4.4 (ref 3.4–5.3)
Sodium: 140 (ref 137–147)

## 2018-06-30 LAB — HEPATIC FUNCTION PANEL
ALT: 26 (ref 10–40)
AST: 21 (ref 14–40)
Alkaline Phosphatase: 55 (ref 25–125)
Bilirubin, Total: 0.4

## 2018-06-30 LAB — COMPREHENSIVE METABOLIC PANEL
Albumin: 4.5 (ref 3.5–5.0)
Calcium: 9.3 (ref 8.7–10.7)
GFR calc Af Amer: 75
GFR calc non Af Amer: 65
Globulin: 2.4

## 2018-06-30 LAB — LIPID PANEL
Cholesterol: 128 (ref 0–200)
HDL: 37 (ref 35–70)
LDL Cholesterol: 65
Triglycerides: 185 — AB (ref 40–160)

## 2018-06-30 LAB — HEMOGLOBIN A1C: Hemoglobin A1C: 6.8

## 2018-06-30 LAB — CBC: RBC: 4.42 (ref 3.87–5.11)

## 2018-06-30 LAB — PSA: PSA: 1.2

## 2018-06-30 LAB — TSH: TSH: 2.28 (ref <=5.90)

## 2019-01-06 ENCOUNTER — Encounter (INDEPENDENT_AMBULATORY_CARE_PROVIDER_SITE_OTHER): Payer: Self-pay | Admitting: *Deleted

## 2019-01-14 ENCOUNTER — Other Ambulatory Visit (INDEPENDENT_AMBULATORY_CARE_PROVIDER_SITE_OTHER): Payer: Self-pay | Admitting: *Deleted

## 2019-01-14 ENCOUNTER — Encounter (INDEPENDENT_AMBULATORY_CARE_PROVIDER_SITE_OTHER): Payer: Self-pay | Admitting: *Deleted

## 2019-01-14 DIAGNOSIS — Z86010 Personal history of colonic polyps: Secondary | ICD-10-CM | POA: Insufficient documentation

## 2019-02-19 ENCOUNTER — Other Ambulatory Visit: Payer: Self-pay

## 2019-02-19 DIAGNOSIS — Z20822 Contact with and (suspected) exposure to covid-19: Secondary | ICD-10-CM

## 2019-02-20 LAB — NOVEL CORONAVIRUS, NAA: SARS-CoV-2, NAA: NOT DETECTED

## 2019-03-13 DIAGNOSIS — E1165 Type 2 diabetes mellitus with hyperglycemia: Secondary | ICD-10-CM

## 2019-03-13 DIAGNOSIS — E785 Hyperlipidemia, unspecified: Secondary | ICD-10-CM

## 2019-03-13 DIAGNOSIS — G4730 Sleep apnea, unspecified: Secondary | ICD-10-CM

## 2019-03-13 DIAGNOSIS — I10 Essential (primary) hypertension: Secondary | ICD-10-CM

## 2019-03-13 DIAGNOSIS — M109 Gout, unspecified: Secondary | ICD-10-CM

## 2019-03-13 DIAGNOSIS — E1169 Type 2 diabetes mellitus with other specified complication: Secondary | ICD-10-CM

## 2019-03-13 DIAGNOSIS — H6690 Otitis media, unspecified, unspecified ear: Secondary | ICD-10-CM | POA: Insufficient documentation

## 2019-03-13 LAB — URIC ACID: Uric Acid: 6.8

## 2019-03-17 ENCOUNTER — Encounter (INDEPENDENT_AMBULATORY_CARE_PROVIDER_SITE_OTHER): Payer: Self-pay | Admitting: *Deleted

## 2019-03-17 ENCOUNTER — Telehealth (INDEPENDENT_AMBULATORY_CARE_PROVIDER_SITE_OTHER): Payer: Self-pay | Admitting: *Deleted

## 2019-03-17 ENCOUNTER — Other Ambulatory Visit (INDEPENDENT_AMBULATORY_CARE_PROVIDER_SITE_OTHER): Payer: Self-pay | Admitting: *Deleted

## 2019-03-17 NOTE — Telephone Encounter (Signed)
Patient needs suprep TCS sch'd 2/10

## 2019-03-19 MED ORDER — SUPREP BOWEL PREP KIT 17.5-3.13-1.6 GM/177ML PO SOLN: 1.0000 | Freq: Once | ORAL | 0 refills | Status: AC

## 2019-03-19 NOTE — Telephone Encounter (Signed)
rx sent to pharmacy

## 2019-04-06 ENCOUNTER — Telehealth (INDEPENDENT_AMBULATORY_CARE_PROVIDER_SITE_OTHER): Payer: Self-pay | Admitting: *Deleted

## 2019-04-06 MED ORDER — SUPREP BOWEL PREP KIT 17.5-3.13-1.6 GM/177ML PO SOLN: 1.0000 | Freq: Once | ORAL | 0 refills | Status: AC

## 2019-04-06 NOTE — Telephone Encounter (Signed)
Referring MD/PCP:   Procedure: tcs  Reason/Indication:  Hx polyps  Has patient had this procedure before?  Yes, 2015  If so, when, by whom and where?    Is there a family history of colon cancer?  no  Who?  What age when diagnosed?    Is patient diabetic?   yes      Does patient have prosthetic heart valve or mechanical valve?  no  Do you have a pacemaker/defibrillator?  no  Has patient ever had endocarditis/atrial fibrillation? no  Does patient use oxygen? no  Has patient had joint replacement within last 12 months?  no  Is patient constipated or do they take laxatives? no  Does patient have a history of alcohol/drug use?  no  Is patient on blood thinner such as Coumadin, Plavix and/or Aspirin? no  Medications: metformin 500 mg bid, simvastatin 40 mg daily, doxycycline 100 mg bid, fish oil daily, MVI daily, folic acid daily, vit 123456 daily, fluticasone daily  Allergies: see epic  Medication Adjustment per Dr Laural Golden: hold metformin evening before and morning of  Procedure date & time: 04/21/19 at 930

## 2019-04-06 NOTE — Telephone Encounter (Signed)
Patient needs suprep TCS sch'd 2/10

## 2019-04-06 NOTE — Telephone Encounter (Signed)
Sent to pharmacy 

## 2019-04-08 ENCOUNTER — Other Ambulatory Visit (INDEPENDENT_AMBULATORY_CARE_PROVIDER_SITE_OTHER): Payer: Self-pay | Admitting: *Deleted

## 2019-04-08 DIAGNOSIS — Z86010 Personal history of colonic polyps: Secondary | ICD-10-CM

## 2019-04-11 NOTE — Telephone Encounter (Signed)
Colonoscopy with conscious sedation 

## 2019-04-19 ENCOUNTER — Other Ambulatory Visit (HOSPITAL_COMMUNITY): Payer: Medicare Other

## 2019-04-19 ENCOUNTER — Other Ambulatory Visit: Payer: Self-pay

## 2019-04-19 ENCOUNTER — Other Ambulatory Visit (HOSPITAL_COMMUNITY)
Admission: RE | Admit: 2019-04-19 | Discharge: 2019-04-19 | Disposition: A | Discharge status: Home / Self Care | Payer: Medicare Other | Source: Ambulatory Visit | Attending: Internal Medicine | Admitting: Internal Medicine

## 2019-04-19 DIAGNOSIS — Z20822 Contact with and (suspected) exposure to covid-19: Secondary | ICD-10-CM | POA: Diagnosis not present

## 2019-04-19 DIAGNOSIS — Z01812 Encounter for preprocedural laboratory examination: Secondary | ICD-10-CM | POA: Diagnosis present

## 2019-04-19 LAB — SARS CORONAVIRUS 2 (TAT 6-24 HRS): SARS Coronavirus 2: NEGATIVE

## 2019-04-21 ENCOUNTER — Encounter (HOSPITAL_COMMUNITY): Payer: Self-pay

## 2019-04-21 ENCOUNTER — Ambulatory Visit (HOSPITAL_COMMUNITY)
Admission: RE | Admit: 2019-04-21 | Discharge: 2019-04-21 | Disposition: A | Discharge status: Home / Self Care | Payer: Medicare Other | Attending: Internal Medicine | Admitting: Internal Medicine

## 2019-04-21 ENCOUNTER — Encounter (HOSPITAL_COMMUNITY)
Admission: RE | Disposition: A | Discharge status: Home / Self Care | Payer: Self-pay | Source: Home / Self Care | Attending: Internal Medicine

## 2019-04-21 ENCOUNTER — Encounter (HOSPITAL_COMMUNITY): Payer: Self-pay | Admitting: Internal Medicine

## 2019-04-21 ENCOUNTER — Other Ambulatory Visit: Payer: Self-pay

## 2019-04-21 ENCOUNTER — Ambulatory Visit (HOSPITAL_COMMUNITY): Admit: 2019-04-21 | Payer: Medicare Other | Admitting: Internal Medicine

## 2019-04-21 DIAGNOSIS — Z8614 Personal history of Methicillin resistant Staphylococcus aureus infection: Secondary | ICD-10-CM | POA: Diagnosis not present

## 2019-04-21 DIAGNOSIS — Z86010 Personal history of colonic polyps: Secondary | ICD-10-CM

## 2019-04-21 DIAGNOSIS — K5730 Diverticulosis of large intestine without perforation or abscess without bleeding: Secondary | ICD-10-CM

## 2019-04-21 DIAGNOSIS — E119 Type 2 diabetes mellitus without complications: Secondary | ICD-10-CM | POA: Insufficient documentation

## 2019-04-21 DIAGNOSIS — Z7984 Long term (current) use of oral hypoglycemic drugs: Secondary | ICD-10-CM | POA: Diagnosis not present

## 2019-04-21 DIAGNOSIS — D125 Benign neoplasm of sigmoid colon: Secondary | ICD-10-CM | POA: Insufficient documentation

## 2019-04-21 DIAGNOSIS — Z1211 Encounter for screening for malignant neoplasm of colon: Secondary | ICD-10-CM | POA: Diagnosis not present

## 2019-04-21 DIAGNOSIS — I10 Essential (primary) hypertension: Secondary | ICD-10-CM | POA: Diagnosis not present

## 2019-04-21 DIAGNOSIS — G4733 Obstructive sleep apnea (adult) (pediatric): Secondary | ICD-10-CM | POA: Insufficient documentation

## 2019-04-21 DIAGNOSIS — K644 Residual hemorrhoidal skin tags: Secondary | ICD-10-CM

## 2019-04-21 DIAGNOSIS — Z87891 Personal history of nicotine dependence: Secondary | ICD-10-CM | POA: Diagnosis not present

## 2019-04-21 DIAGNOSIS — D123 Benign neoplasm of transverse colon: Secondary | ICD-10-CM | POA: Insufficient documentation

## 2019-04-21 DIAGNOSIS — Z09 Encounter for follow-up examination after completed treatment for conditions other than malignant neoplasm: Secondary | ICD-10-CM

## 2019-04-21 HISTORY — PX: POLYPECTOMY: SHX5525

## 2019-04-21 HISTORY — PX: COLONOSCOPY: SHX5424

## 2019-04-21 LAB — GLUCOSE, CAPILLARY: Glucose-Capillary: 116 mg/dL — ABNORMAL HIGH (ref 70–99)

## 2019-04-21 SURGERY — COLONOSCOPY
Anesthesia: Moderate Sedation

## 2019-04-21 MED ORDER — STERILE WATER FOR IRRIGATION IR SOLN
Status: DC | PRN
  Administered 2019-04-21: 1.5 mL

## 2019-04-21 MED ORDER — MIDAZOLAM HCL 5 MG/5ML IJ SOLN
INTRAMUSCULAR | Status: AC
  Filled 2019-04-21: qty 10

## 2019-04-21 MED ORDER — MEPERIDINE HCL 50 MG/ML IJ SOLN
INTRAMUSCULAR | Status: DC | PRN
  Administered 2019-04-21 (×2): 25 mg via INTRAVENOUS

## 2019-04-21 MED ORDER — SODIUM CHLORIDE 0.9 % IV SOLN
INTRAVENOUS | Status: DC
  Administered 2019-04-21: 1000 mL via INTRAVENOUS

## 2019-04-21 MED ORDER — MEPERIDINE HCL 50 MG/ML IJ SOLN
INTRAMUSCULAR | Status: AC
  Filled 2019-04-21: qty 1

## 2019-04-21 MED ORDER — MIDAZOLAM HCL 5 MG/5ML IJ SOLN
INTRAMUSCULAR | Status: DC | PRN
  Administered 2019-04-21 (×2): 1 mg via INTRAVENOUS
  Administered 2019-04-21: 2 mg via INTRAVENOUS
  Administered 2019-04-21: 1 mg via INTRAVENOUS
  Administered 2019-04-21: 2 mg via INTRAVENOUS

## 2019-04-21 NOTE — H&P (Signed)
Michael Odom is an 69 y.o. male.   Chief Complaint: Patient is here for colonoscopy. HPI: Patient 69 year old Caucasian male who has a history of colonic polyps and is here for surveillance colonoscopy.  He has had adenomas removed on 2 prior colonoscopies.  Last one was in October 2015 with removal of 4 small polyps and these were tubular adenomas.  He denies abdominal pain change in bowel habits or rectal bleeding.  He does not take aspirin or anticoagulants. Family history is negative for CRC.  Past Medical History:  Diagnosis Date  . Acute infective otitis externa, unspecified laterality   . Arthritis   . Chronic ear infection   . Essential (primary) hypertension   .    Marland Kitchen Gout, unspecified   .    .    . History of colon polyps   . History of MRSA infection    2003--  RIGHT KNEE ABSCESS  . Hyperlipidemia   . Hyperlipidemia, unspecified   .    Marland Kitchen OSA (obstructive sleep apnea)    2000--  POSITIVE SLEEP STUDY /  NON-COMPLIANT CPAP  . Right hydrocele   . Sleep apnea, unspecified   .    .    . Type 2 diabetes mellitus with other specified complication [facility])         Past Surgical History:  Procedure Laterality Date  . COLONOSCOPY N/A 12/29/2013   Procedure: COLONOSCOPY;  Surgeon: Rogene Houston, MD;  Location: [facility];  Service: Endoscopy;  Laterality: N/A;  1200  . HYDROCELE EXCISION Right 01/11/2014   Procedure: RIGHT HYDROCELECTOMY ADULT;  Surgeon: Malka So, MD;  Location: [facility];  Service: Urology;  Laterality: Right;  . I  &  D RIGHT KNEE ABSCESS  2003    Family History  Problem Relation Age of Onset  . Colon cancer Neg Hx    Social History:  reports that he quit smoking about 23 years ago. His smoking use included cigarettes. He has a 30.00 pack-year smoking history. He quit smokeless tobacco use about 25 years ago.  His smokeless tobacco use included snuff. He reports that he does not drink alcohol or use drugs.  Allergies:  Allergies   Allergen Reactions  . Guaifenesin & Derivatives Itching and Rash  . Sulfa Antibiotics Swelling and Rash    Any thing with sulfa or sulfa deriratives  . Vancomycin Other (See Comments)    redman syndrome  . Neosporin [Neomycin-Bacitracin Zn-Polymyx] Itching and Rash    Medications Prior to Admission  Medication Sig Dispense Refill  . ciprofloxacin-dexamethasone (CIPRODEX) otic suspension Place 2 drops into both ears 4 (four) times daily as needed (ear irritation/infection.).     Marland Kitchen doxycycline (VIBRA-TABS) 100 MG tablet Take 100 mg by mouth See admin instructions. Take 1 tablet (100 mg) by mouth scheduled at night, may take an additional dose in the morning if needed for rosacea flare ups    . fluticasone (FLONASE) 50 MCG/ACT nasal spray Place 2 sprays into both nostrils daily. In the morning.    . folic acid (FOLVITE) 725 MCG tablet Take 800 mcg by mouth every evening.    . metFORMIN (GLUCOPHAGE) 500 MG tablet Take 500 mg by mouth 2 (two) times daily with a meal.    . metroNIDAZOLE (METROCREAM) 0.75 % cream Apply 1 application topically daily.    . Multiple Vitamin (MULTIVITAMIN WITH MINERALS) TABS tablet Take 1 tablet by mouth daily. Centrum Silver    . Omega-3 Fatty Acids (  DIALYVITE OMEGA-3 CONCENTRATE) 600 MG CAPS Take 600 mg by mouth every evening.    . simvastatin (ZOCOR) 40 MG tablet Take 40 mg by mouth at bedtime.    Manus Gunning BOWEL PREP KIT 17.5-3.13-1.6 GM/177ML SOLN Take 354 mLs by mouth once.    Carren Rang COMPLETE 0.6 % SOLN Place 1 drop into both eyes daily.    . vitamin B-12 (CYANOCOBALAMIN) 1000 MCG tablet Take 1,000 mcg by mouth every evening.      Results for orders placed or performed during the hospital encounter of 04/21/19 (from the past 48 hour(s))  Glucose, capillary     Status: Abnormal   Collection Time: 04/21/19 10:22 AM  Result Value Ref Range   Glucose-Capillary 116 (H) 70 - 99 mg/dL   No results found.  Review of Systems  Blood pressure (!) 153/83, pulse  78, temperature 98.2 F (36.8 C), temperature source Oral, resp. rate 15, height 5' 7.5" (1.715 m), weight 108.9 kg, SpO2 97 %. Physical Exam  Constitutional: He appears well-developed and well-nourished.  HENT:  Mouth/Throat: Oropharynx is clear and moist.  Eyes: Conjunctivae are normal. No scleral icterus.  Neck: No thyromegaly present.  Cardiovascular: Normal rate, regular rhythm and normal heart sounds.  No murmur heard. Respiratory: Effort normal and breath sounds normal.  GI:  Umbilical hernia noted.  It is completely reducible.  Skin is thin but normal color.  Abdomen is soft and nontender without organomegaly or masses.  Musculoskeletal:        General: No edema.  Lymphadenopathy:    He has no cervical adenopathy.  Neurological: He is alert.  Skin: Skin is warm and dry.     Assessment/Plan  History of colonic adenomas Surveillance colonoscopy  Hildred Laser, MD 04/21/2019, 10:58 AM

## 2019-04-21 NOTE — Discharge Instructions (Signed)
No aspirin or NSAIDs for 24 hours. Resume usual medications as before. Modified carb high-fiber diet. No driving for 24 hours. Physician will call with biopsy results.   Colonoscopy, Adult, Care After This sheet gives you information about how to care for yourself after your procedure. Your doctor may also give you more specific instructions. If you have problems or questions, call your doctor. What can I expect after the procedure? After the procedure, it is common to have:  A small amount of blood in your poop (stool) for 24 hours.  Some gas.  Mild cramping or bloating in your belly (abdomen). Follow these instructions at home: Eating and drinking   Drink enough fluid to keep your pee (urine) pale yellow.  Follow instructions from your doctor about what you cannot eat or drink.  Return to your normal diet as told by your doctor. Avoid heavy or fried foods that are hard to digest. Activity  Rest as told by your doctor.  Do not sit for a long time without moving. Get up to take short walks every 1-2 hours. This is important. Ask for help if you feel weak or unsteady.  Return to your normal activities as told by your doctor. Ask your doctor what activities are safe for you. To help cramping and bloating:   Try walking around.  Put heat on your belly as told by your doctor. Use the heat source that your doctor recommends, such as a moist heat pack or a heating pad. ? Put a towel between your skin and the heat source. ? Leave the heat on for 20-30 minutes. ? Remove the heat if your skin turns bright red. This is very important if you are unable to feel pain, heat, or cold. You may have a greater risk of getting burned. General instructions  For the first 24 hours after the procedure: ? Do not drive or use machinery. ? Do not sign important documents. ? Do not drink alcohol. ? Do your daily activities more slowly than normal. ? Eat foods that are soft and easy to  digest.  Take over-the-counter or prescription medicines only as told by your doctor.  Keep all follow-up visits as told by your doctor. This is important. Contact a doctor if:  You have blood in your poop 2-3 days after the procedure. Get help right away if:  You have more than a small amount of blood in your poop.  You see large clumps of tissue (blood clots) in your poop.  Your belly is swollen.  You feel like you may vomit (nauseous).  You vomit.  You have a fever.  You have belly pain that gets worse, and medicine does not help your pain. Summary  After the procedure, it is common to have a small amount of blood in your poop. You may also have mild cramping and bloating in your belly.  For the first 24 hours after the procedure, do not drive or use machinery, do not sign important documents, and do not drink alcohol.  Get help right away if you have a lot of blood in your poop, feel like you may vomit, have a fever, or have more belly pain. This information is not intended to replace advice given to you by your health care provider. Make sure you discuss any questions you have with your health care provider. Document Revised: 09/21/2018 Document Reviewed: 09/21/2018 Elsevier Patient Education  [place].  Colon Polyps  Polyps are tissue growths inside the body.  Polyps can grow in many places, including the large intestine (colon). A polyp may be a round bump or a mushroom-shaped growth. You could have one polyp or several. Most colon polyps are noncancerous (benign). However, some colon polyps can become cancerous over time. Finding and removing the polyps early can help prevent this. What are the causes? The exact cause of colon polyps is not known. What increases the risk? You are more likely to develop this condition if you:  Have a family history of colon cancer or colon polyps.  Are older than 64 or older than 45 if you are African American.  Have  inflammatory bowel disease, such as ulcerative colitis or Crohn's disease.  Have certain hereditary conditions, such as: ? Familial adenomatous polyposis. ? Lynch syndrome. ? Turcot syndrome. ? Peutz-Jeghers syndrome.  Are overweight.  Smoke cigarettes.  Do not get enough exercise.  Drink too much alcohol.  Eat a diet that is high in fat and red meat and low in fiber.  Had childhood cancer that was treated with abdominal radiation. What are the signs or symptoms? Most polyps do not cause symptoms. If you have symptoms, they may include:  Blood coming from your rectum when having a bowel movement.  Blood in your stool. The stool may look dark red or black.  Abdominal pain.  A change in bowel habits, such as constipation or diarrhea. How is this diagnosed? This condition is diagnosed with a colonoscopy. This is a procedure in which a lighted, flexible scope is inserted into the anus and then passed into the colon to examine the area. Polyps are sometimes found when a colonoscopy is done as part of routine cancer screening tests. How is this treated? Treatment for this condition involves removing any polyps that are found. Most polyps can be removed during a colonoscopy. Those polyps will then be tested for cancer. Additional treatment may be needed depending on the results of testing. Follow these instructions at home: Lifestyle  Maintain a healthy weight, or lose weight if recommended by your health care provider.  Exercise every day or as told by your health care provider.  Do not use any products that contain nicotine or tobacco, such as cigarettes and e-cigarettes. If you need help quitting, ask your health care provider.  If you drink alcohol, limit how much you have: ? 0-1 drink a day for women. ? 0-2 drinks a day for men.  Be aware of how much alcohol is in your drink. In the [place] one drink equals one 12 oz bottle of beer (355 mL), one 5 oz glass of wine (148 mL),  or one 1 oz shot of hard liquor (44 mL). Eating and drinking   Eat foods that are high in fiber, such as fruits, vegetables, and whole grains.  Eat foods that are high in calcium and vitamin D, such as milk, cheese, yogurt, eggs, liver, fish, and broccoli.  Limit foods that are high in fat, such as fried foods and desserts.  Limit the amount of red meat and processed meat you eat, such as hot dogs, sausage, bacon, and lunch meats. General instructions  Keep all follow-up visits as told by your health care provider. This is important. ? This includes having regularly scheduled colonoscopies. ? Talk to your health care provider about when you need a colonoscopy. Contact a health care provider if:  You have new or worsening bleeding during a bowel movement.  You have new or increased blood in your stool.  You have a change in bowel habits.  You lose weight for no known reason. Summary  Polyps are tissue growths inside the body. Polyps can grow in many places, including the colon.  Most colon polyps are noncancerous (benign), but some can become cancerous over time.  This condition is diagnosed with a colonoscopy.  Treatment for this condition involves removing any polyps that are found. Most polyps can be removed during a colonoscopy. This information is not intended to replace advice given to you by your health care provider. Make sure you discuss any questions you have with your health care provider. Document Revised: 06/12/2017 Document Reviewed: 06/12/2017 Elsevier Patient Education  [place].  Diverticulosis  Diverticulosis is a condition that develops when small pouches (diverticula) form in the wall of the large intestine (colon). The colon is where water is absorbed and stool (feces) is formed. The pouches form when the inside layer of the colon pushes through weak spots in the outer layers of the colon. You may have a few pouches or many of them. The pouches  usually do not cause problems unless they become inflamed or infected. When this happens, the condition is called diverticulitis. What are the causes? The cause of this condition is not known. What increases the risk? The following factors may make you more likely to develop this condition:  Being older than age 7. Your risk for this condition increases with age. Diverticulosis is rare among people younger than age 70. By age 52, many people have it.  Eating a low-fiber diet.  Having frequent constipation.  Being overweight.  Not getting enough exercise.  Smoking.  Taking over-the-counter pain medicines, like aspirin and ibuprofen.  Having a family history of diverticulosis. What are the signs or symptoms? In most people, there are no symptoms of this condition. If you do have symptoms, they may include:  Bloating.  Cramps in the abdomen.  Constipation or diarrhea.  Pain in the lower left side of the abdomen. How is this diagnosed? Because diverticulosis usually has no symptoms, it is most often diagnosed during an exam for other colon problems. The condition may be diagnosed by:  Using a flexible scope to examine the colon (colonoscopy).  Taking an X-ray of the colon after dye has been put into the colon (barium enema).  Having a CT scan. How is this treated? You may not need treatment for this condition. Your health care provider may recommend treatment to prevent problems. You may need treatment if you have symptoms or if you previously had diverticulitis. Treatment may include:  Eating a high-fiber diet.  Taking a fiber supplement.  Taking a live bacteria supplement (probiotic).  Taking medicine to relax your colon. Follow these instructions at home: Medicines  Take over-the-counter and prescription medicines only as told by your health care provider.  If told by your health care provider, take a fiber supplement or probiotic. Constipation prevention Your  condition may cause constipation. To prevent or treat constipation, you may need to:  Drink enough fluid to keep your urine pale yellow.  Take over-the-counter or prescription medicines.  Eat foods that are high in fiber, such as beans, whole grains, and fresh fruits and vegetables.  Limit foods that are high in fat and processed sugars, such as fried or sweet foods.  General instructions  Try not to strain when you have a bowel movement.  Keep all follow-up visits as told by your health care provider. This is important. Contact a health care  provider if you:  Have pain in your abdomen.  Have bloating.  Have cramps.  Have not had a bowel movement in 3 days. Get help right away if:  Your pain gets worse.  Your bloating becomes very bad.  You have a fever or chills, and your symptoms suddenly get worse.  You vomit.  You have bowel movements that are bloody or black.  You have bleeding from your rectum. Summary  Diverticulosis is a condition that develops when small pouches (diverticula) form in the wall of the large intestine (colon).  You may have a few pouches or many of them.  This condition is most often diagnosed during an exam for other colon problems.  Treatment may include increasing the fiber in your diet, taking supplements, or taking medicines. This information is not intended to replace advice given to you by your health care provider. Make sure you discuss any questions you have with your health care provider. Document Revised: 09/24/2018 Document Reviewed: 09/24/2018 Elsevier Patient Education  [place].  High-Fiber Diet Fiber, also called dietary fiber, is a type of carbohydrate that is found in fruits, vegetables, whole grains, and beans. A high-fiber diet can have many health benefits. Your health care provider may recommend a high-fiber diet to help:  Prevent constipation. Fiber can make your bowel movements more regular.  Lower your  cholesterol.  Relieve the following conditions: ? Swelling of veins in the anus (hemorrhoids). ? Swelling and irritation (inflammation) of specific areas of the digestive tract (uncomplicated diverticulosis). ? A problem of the large intestine (colon) that sometimes causes pain and diarrhea (irritable bowel syndrome, IBS).  Prevent overeating as part of a weight-loss plan.  Prevent heart disease, type 2 diabetes, and certain cancers. What is my plan? The recommended daily fiber intake in grams (g) includes:  38 g for men age 13 or younger.  30 g for men over age 48.  102 g for women age 57 or younger.  21 g for women over age 81. You can get the recommended daily intake of dietary fiber by:  Eating a variety of fruits, vegetables, grains, and beans.  Taking a fiber supplement, if it is not possible to get enough fiber through your diet. What do I need to know about a high-fiber diet?  It is better to get fiber through food sources rather than from fiber supplements. There is not a lot of research about how effective supplements are.  Always check the fiber content on the nutrition facts label of any prepackaged food. Look for foods that contain 5 g of fiber or more per serving.  Talk with a diet and nutrition specialist (dietitian) if you have questions about specific foods that are recommended or not recommended for your medical condition, especially if those foods are not listed below.  Gradually increase how much fiber you consume. If you increase your intake of dietary fiber too quickly, you may have bloating, cramping, or gas.  Drink plenty of water. Water helps you to digest fiber. What are tips for following this plan?  Eat a wide variety of high-fiber foods.  Make sure that half of the grains that you eat each day are whole grains.  Eat breads and cereals that are made with whole-grain flour instead of refined flour or white flour.  Eat brown rice, bulgur wheat, or  millet instead of white rice.  Start the day with a breakfast that is high in fiber, such as a cereal that contains 5  g of fiber or more per serving.  Use beans in place of meat in soups, salads, and pasta dishes.  Eat high-fiber snacks, such as berries, raw vegetables, nuts, and popcorn.  Choose whole fruits and vegetables instead of processed forms like juice or sauce. What foods can I eat?  Fruits Berries. Pears. Apples. Oranges. Avocado. Prunes and raisins. Dried figs. Vegetables Sweet potatoes. Spinach. Kale. Artichokes. Cabbage. Broccoli. Cauliflower. Green peas. Carrots. Squash. Grains Whole-grain breads. Multigrain cereal. Oats and oatmeal. Brown rice. Barley. Bulgur wheat. [place]. Quinoa. Bran muffins. Popcorn. Rye wafer crackers. Meats and other proteins Navy, kidney, and pinto beans. Soybeans. Split peas. Lentils. Nuts and seeds. Dairy Fiber-fortified yogurt. Beverages Fiber-fortified soy milk. Fiber-fortified orange juice. Other foods Fiber bars. The items listed above may not be a complete list of recommended foods and beverages. Contact a dietitian for more options. What foods are not recommended? Fruits Fruit juice. Cooked, strained fruit. Vegetables Fried potatoes. Canned vegetables. Well-cooked vegetables. Grains White bread. Pasta made with refined flour. White rice. Meats and other proteins Fatty cuts of meat. Fried chicken or fried fish. Dairy Milk. Yogurt. Cream cheese. Sour cream. Fats and oils Butters. Beverages Soft drinks. Other foods Cakes and pastries. The items listed above may not be a complete list of foods and beverages to avoid. Contact a dietitian for more information. Summary  Fiber is a type of carbohydrate. It is found in fruits, vegetables, whole grains, and beans.  There are many health benefits of eating a high-fiber diet, such as preventing constipation, lowering blood cholesterol, helping with weight loss, and reducing your risk  of heart disease, diabetes, and certain cancers.  Gradually increase your intake of fiber. Increasing too fast can result in cramping, bloating, and gas. Drink plenty of water while you increase your fiber.  The best sources of fiber include whole fruits and vegetables, whole grains, nuts, seeds, and beans. This information is not intended to replace advice given to you by your health care provider. Make sure you discuss any questions you have with your health care provider. Document Revised: 12/30/2016 Document Reviewed: 12/30/2016 Elsevier Patient Education  2020 [employer].  Hemorrhoids Hemorrhoids are swollen veins that may develop:  In the butt (rectum). These are called internal hemorrhoids.  Around the opening of the butt (anus). These are called external hemorrhoids. Hemorrhoids can cause pain, itching, or bleeding. Most of the time, they do not cause serious problems. They usually get better with diet changes, lifestyle changes, and other home treatments. What are the causes? This condition may be caused by:  Having trouble pooping (constipation).  Pushing hard (straining) to poop.  Watery poop (diarrhea).  Pregnancy.  Being very overweight (obese).  Sitting for long periods of time.  Heavy lifting or other activity that causes you to strain.  Anal sex.  Riding a bike for a long period of time. What are the signs or symptoms? Symptoms of this condition include:  Pain.  Itching or soreness in the butt.  Bleeding from the butt.  Leaking poop.  Swelling in the area.  One or more lumps around the opening of your butt. How is this diagnosed? A doctor can often diagnose this condition by looking at the affected area. The doctor may also:  Do an exam that involves feeling the area with a gloved hand (digital rectal exam).  Examine the area inside your butt using a small tube (anoscope).  Order blood tests. This may be done if you have lost a lot of  blood.  Have you get a test that involves looking inside the colon using a flexible tube with a camera on the end (sigmoidoscopy or colonoscopy). How is this treated? This condition can usually be treated at home. Your doctor may tell you to change what you eat, make lifestyle changes, or try home treatments. If these do not help, procedures can be done to remove the hemorrhoids or make them smaller. These may involve:  Placing rubber bands at the base of the hemorrhoids to cut off their blood supply.  Injecting medicine into the hemorrhoids to shrink them.  Shining a type of light energy onto the hemorrhoids to cause them to fall off.  Doing surgery to remove the hemorrhoids or cut off their blood supply. Follow these instructions at home: Eating and drinking   Eat foods that have a lot of fiber in them. These include whole grains, beans, nuts, fruits, and vegetables.  Ask your doctor about taking products that have added fiber (fibersupplements).  Reduce the amount of fat in your diet. You can do this by: ? Eating low-fat dairy products. ? Eating less red meat. ? Avoiding processed foods.  Drink enough fluid to keep your pee (urine) pale yellow. Managing pain and swelling   Take a warm-water bath (sitz bath) for 20 minutes to ease pain. Do this 3-4 times a day. You may do this in a bathtub or using a portable sitz bath that fits over the toilet.  If told, put ice on the painful area. It may be helpful to use ice between your warm baths. ? Put ice in a plastic bag. ? Place a towel between your skin and the bag. ? Leave the ice on for 20 minutes, 2-3 times a day. General instructions  Take over-the-counter and prescription medicines only as told by your doctor. ? Medicated creams and medicines may be used as told.  Exercise often. Ask your doctor how much and what kind of exercise is best for you.  Go to the bathroom when you have the urge to poop. Do not wait.  Avoid  pushing too hard when you poop.  Keep your butt dry and clean. Use wet toilet paper or moist towelettes after pooping.  Do not sit on the toilet for a long time.  Keep all follow-up visits as told by your doctor. This is important. Contact a doctor if you:  Have pain and swelling that do not get better with treatment or medicine.  Have trouble pooping.  Cannot poop.  Have pain or swelling outside the area of the hemorrhoids. Get help right away if you have:  Bleeding that will not stop. Summary  Hemorrhoids are swollen veins in the butt or around the opening of the butt.  They can cause pain, itching, or bleeding.  Eat foods that have a lot of fiber in them. These include whole grains, beans, nuts, fruits, and vegetables.  Take a warm-water bath (sitz bath) for 20 minutes to ease pain. Do this 3-4 times a day. This information is not intended to replace advice given to you by your health care provider. Make sure you discuss any questions you have with your health care provider. Document Revised: 03/05/2018 Document Reviewed: 07/17/2017 Elsevier Patient Education  [place].

## 2019-04-21 NOTE — Op Note (Signed)
[facility] Patient Name: Michael Odom Procedure Date: 04/21/2019 10:44 AM MRN: [id number] Date of Birth: [birth year] Attending MD: Hildred Laser , MD CSN: [id number] Age: 69 Admit Type: Outpatient Procedure:                Colonoscopy Indications:              High risk colon cancer surveillance: Personal                            history of colonic polyps Providers:                Hildred Laser, MD, Otis Peak B. Sharon Seller, RN, Raphael Gibney, Technician Referring MD:             Jasper Loser. Luan Pulling, MD Medicines:                Meperidine 50 mg IV, Midazolam 7 mg IV Complications:            No immediate complications. Estimated Blood Loss:     Estimated blood loss was minimal. Procedure:                Pre-Anesthesia Assessment:                           - Prior to the procedure, a History and Physical                            was performed, and patient medications and                            allergies were reviewed. The patient's tolerance of                            previous anesthesia was also reviewed. The risks                            and benefits of the procedure and the sedation                            options and risks were discussed with the patient.                            All questions were answered, and informed consent                            was obtained. Prior Anticoagulants: The patient has                            taken no previous anticoagulant or antiplatelet                            agents. ASA Grade Assessment: II - A patient with  mild systemic disease. After reviewing the risks                            and benefits, the patient was deemed in                            satisfactory condition to undergo the procedure.                           After obtaining informed consent, the colonoscope                            was passed under direct vision. Throughout the          procedure, the patient's blood pressure, pulse, and                            oxygen saturations were monitored continuously. The                            [id number] scope was introduced through                            the anus and advanced to the the cecum, identified                            by appendiceal orifice and ileocecal valve. The                            colonoscopy was performed without difficulty. The                            patient tolerated the procedure well. The quality                            of the bowel preparation was good. The ileocecal                            valve, appendiceal orifice, and rectum were                            photographed. Scope In: 11:08:55 AM Scope Out: 11:38:38 AM Scope Withdrawal Time: 0 hours 10 minutes 11 seconds  Total Procedure Duration: 0 hours 29 minutes 43 seconds  Findings:      The perianal and digital rectal examinations were normal.      Two sessile polyps were found in the proximal transverse colon. The       polyps were 5 mm in size. These polyps were removed with a cold snare.       Resection and retrieval were complete. The pathology specimen was placed       into Bottle Number 1.      A small polyp was found in the proximal sigmoid colon. The polyp was       sessile. Biopsies were taken with a cold forceps for histology. The  pathology specimen was placed into Bottle Number 1.      Multiple diverticula were found in the sigmoid colon.      External hemorrhoids were found during retroflexion. The hemorrhoids       were medium-sized. Impression:               - Two 5 mm polyps in the proximal transverse colon,                            removed with a cold snare. Resected and retrieved.                           - One small polyp in the proximal sigmoid colon.                            Biopsied.                           - Diverticulosis in the sigmoid colon.                            - External hemorrhoids. Moderate Sedation:      Moderate (conscious) sedation was administered by the endoscopy nurse       and supervised by the endoscopist. The following parameters were       monitored: oxygen saturation, heart rate, blood pressure, CO2       capnography and response to care. Total physician intraservice time was       34 minutes. Recommendation:           - Patient has a contact number available for                            emergencies. The signs and symptoms of potential                            delayed complications were discussed with the                            patient. Return to normal activities tomorrow.                            Written discharge instructions were provided to the                            patient.                           - High fiber diet and diabetic (ADA) diet today.                           - Continue present medications.                           - No aspirin, ibuprofen, naproxen, or other  non-steroidal anti-inflammatory drugs for 1 day.                           - Await pathology results.                           - Repeat colonoscopy in 5 years for surveillance. Procedure Code(s):        --- Professional ---                           [phone], Colonoscopy, flexible; with removal of                            tumor(s), polyp(s), or other lesion(s) by snare                            technique                           45380, 59, Colonoscopy, flexible; with biopsy,                            single or multiple                           99153, Moderate sedation; each additional 15                            minutes intraservice time                           G0500, Moderate sedation services provided by the                            same physician or other qualified health care                            professional performing a gastrointestinal                            endoscopic service that  sedation supports,                            requiring the presence of an independent trained                            observer to assist in the monitoring of the                            patient's level of consciousness and physiological                            status; initial 15 minutes of intra-service time;                            patient age 61 years or older (  additional time may                            be reported with [phone], as appropriate) Diagnosis Code(s):        --- Professional ---                           K63.5, Polyp of colon                           Z86.010, Personal history of colonic polyps                           K64.4, Residual hemorrhoidal skin tags                           K57.30, Diverticulosis of large intestine without                            perforation or abscess without bleeding CPT copyright 2019 American [employer] Association. All rights reserved. The codes documented in this report are preliminary and upon coder review may  be revised to meet current compliance requirements. Hildred Laser, MD Hildred Laser, MD 04/21/2019 11:47:03 AM This report has been signed electronically. Number of Addenda: 0

## 2019-04-22 LAB — SURGICAL PATHOLOGY

## 2019-06-07 ENCOUNTER — Ambulatory Visit: Payer: Medicare Other | Admitting: Family Medicine

## 2020-11-02 ENCOUNTER — Ambulatory Visit (INDEPENDENT_AMBULATORY_CARE_PROVIDER_SITE_OTHER): Payer: Medicare Other | Admitting: Gastroenterology

## 2020-11-02 ENCOUNTER — Other Ambulatory Visit (INDEPENDENT_AMBULATORY_CARE_PROVIDER_SITE_OTHER): Payer: Self-pay

## 2020-11-02 ENCOUNTER — Encounter (INDEPENDENT_AMBULATORY_CARE_PROVIDER_SITE_OTHER): Payer: Self-pay

## 2020-11-02 ENCOUNTER — Encounter (INDEPENDENT_AMBULATORY_CARE_PROVIDER_SITE_OTHER): Payer: Self-pay | Admitting: Gastroenterology

## 2020-11-02 ENCOUNTER — Other Ambulatory Visit: Payer: Self-pay

## 2020-11-02 VITALS — BP 142/83 | HR 101 | Temp 98.5°F | Ht 68.0 in | Wt 199.0 lb

## 2020-11-02 DIAGNOSIS — R634 Abnormal weight loss: Secondary | ICD-10-CM

## 2020-11-02 DIAGNOSIS — R1310 Dysphagia, unspecified: Secondary | ICD-10-CM

## 2020-11-02 NOTE — Patient Instructions (Signed)
Plan for EGD tomorrow. Please do not eat any type of solid foods today or tomorrow before procedure.

## 2020-11-02 NOTE — H&P (View-Only) (Signed)
Referring Provider: Garnet Sierras, NP Primary Care Physician:  Garnet Sierras, NP Primary GI Physician: Rehman  Chief Complaint  Patient presents with   Dysphagia    Trouble swallowing for past 6 weeks. States he has lost 46lbs in 6 weeks. States everything comes back up. Nausea, vomiting, abd pain, has one bm about every 2 -3 days,    HPI:   Michael Odom is a 70 y.o. male with past medical history of HTN, high cholesterol, sleep apnea, DM, Colonic polyps, and gout.   Patient presenting today for new issues with swallowing and weight loss.  Dysphagia:states that for a while he was having issues with meat and pills getting stuck when swallowing, about 6 weeks ago it became worse, with everything he ate or drank coming back up. States that even drinking water or boost shakes at this point is difficult. He reports that he can sometimes do ice chips. Anything he tries to eat or drinks stops in mid esophagus and he has to bring it back up.   States he has lost around 45 pounds in 6 weeks. Reports upper abdominal pain that is constant, no relieving factors. Denies nausea. Denies melena or hematochezia. Denies any frequent NSAID use. Denies any previous history of dysphagia, no prior EGD.  Has BM every 2-3 days, denies constipation or diarrhea.   Last Colonoscopy:(04/21/19)- Two 5 mm polyps in the proximal transverse colon, (tubular adenoma) - One small polyp in the proximal sigmoid colon. Biopsied. - Diverticulosis in the sigmoid colon. - External hemorrhoids. Last Endoscopy:n/a  Recommendations:  EGD  Past Medical History:  Diagnosis Date   Acute infective otitis externa, unspecified laterality    Arthritis    Chronic ear infection    Essential (primary) hypertension    Frequency of urination    Gout, unspecified    High blood pressure    High cholesterol    History of colon polyps    History of MRSA infection    2003--  RIGHT KNEE ABSCESS   Hyperlipidemia     Hyperlipidemia, unspecified    Nocturia    OSA (obstructive sleep apnea)    2000--  POSITIVE SLEEP STUDY /  NON-COMPLIANT CPAP   Right hydrocele    Sleep apnea, unspecified    Type 2 diabetes mellitus ([place])    Type 2 diabetes mellitus with hyperglycemia ([place])    Type 2 diabetes mellitus with other specified complication ([place])    Urgency of urination     Past Surgical History:  Procedure Laterality Date   COLONOSCOPY N/A 12/29/2013   Procedure: COLONOSCOPY;  Surgeon: Rogene Houston, MD;  [facility];  Service: Endoscopy;  Laterality: N/A;  1200   COLONOSCOPY N/A 04/21/2019   Procedure: COLONOSCOPY;  Surgeon: Rogene Houston, MD;  [facility];  Service: Endoscopy;  Laterality: N/A;  1055   HYDROCELE EXCISION Right 01/11/2014   Procedure: RIGHT HYDROCELECTOMY ADULT;  Surgeon: Malka So, MD;  [facility];  Service: Urology;  Laterality: Right;   I  &  D RIGHT KNEE ABSCESS  2003   POLYPECTOMY  04/21/2019   Procedure: POLYPECTOMY;  Surgeon: Rogene Houston, MD;  [facility];  Service: Endoscopy;;    Current Outpatient Medications  Medication Sig Dispense Refill   ciprofloxacin-dexamethasone (CIPRODEX) otic suspension Place 2 drops into both ears 4 (four) times daily as needed (ear irritation/infection.).      doxycycline (VIBRA-TABS) 100 MG tablet Take 100 mg  by mouth See admin instructions. Take 1 tablet (100 mg) by mouth scheduled at night, may take an additional dose in the morning if needed for rosacea flare ups     fluticasone (FLONASE) 50 MCG/ACT nasal spray Place 2 sprays into both nostrils daily. In the morning.     folic acid (FOLVITE) Q000111Q MCG tablet Take 800 mcg by mouth every evening.     metFORMIN (GLUCOPHAGE) 500 MG tablet Take 500 mg by mouth 2 (two) times daily with a meal.     metroNIDAZOLE (METROCREAM) 0.75 % cream Apply 1 application topically daily.     Multiple Vitamin (MULTIVITAMIN WITH MINERALS) TABS  tablet Take 1 tablet by mouth daily. Centrum Silver     Omega-3 Fatty Acids (DIALYVITE OMEGA-3 CONCENTRATE) 600 MG CAPS Take 600 mg by mouth every evening.     simvastatin (ZOCOR) 40 MG tablet Take 40 mg by mouth at bedtime.     SYSTANE COMPLETE 0.6 % SOLN Place 1 drop into both eyes daily.     vitamin B-12 (CYANOCOBALAMIN) 1000 MCG tablet Take 1,000 mcg by mouth every evening.     No current facility-administered medications for this visit.    Allergies as of 11/02/2020 - Review Complete 11/02/2020  Allergen Reaction Noted   Guaifenesin & derivatives Itching and Rash 12/15/2013   Sulfa antibiotics Swelling and Rash 12/15/2013   Vancomycin Other (See Comments) 12/15/2013   Neosporin [neomycin-bacitracin zn-polymyx] Itching and Rash 01/07/2014    Family History  Problem Relation Age of Onset   Colon cancer Neg Hx     Social History   Socioeconomic History   Marital status: Married    Spouse name: Not on file   Number of children: Not on file   Years of education: Not on file   Highest education level: Not on file  Occupational History   Not on file  Tobacco Use   Smoking status: Former    Packs/day: 1.00    Years: 30.00    Pack years: 30.00    Types: Cigarettes    Quit date: 12/30/1995    Years since quitting: 24.8   Smokeless tobacco: Former    Types: Snuff    Quit date: 01/07/1994  Substance and Sexual Activity   Alcohol use: No   Drug use: No   Sexual activity: Not on file  Other Topics Concern   Not on file  Social History Narrative   Not on file   Social Determinants of Health   Financial Resource Strain: Not on file  Food Insecurity: Not on file  Transportation Needs: Not on file  Physical Activity: Not on file  Stress: Not on file  Social Connections: Not on file   Review of Systems: Gen: Denies fever, chills, anorexia. Denies fatigue, weakness. +weight loss CV: Denies chest pain, palpitations, syncope, peripheral edema, and claudication. Resp:  Denies dyspnea at rest, cough, wheezing, coughing up blood, and pleurisy. GI: Denies vomiting blood, jaundice, and fecal incontinence. + dysphagia, abdomianl pain Derm: Denies rash, itching, dry skin Psych: Denies depression, anxiety, memory loss, confusion. No homicidal or suicidal ideation.  Heme: Denies bruising, bleeding, and enlarged lymph nodes.  Physical Exam: BP (!) 142/83 (BP Location: Left Arm, Patient Position: Sitting, Cuff Size: Large)   Pulse (!) 101   Temp 98.5 F (36.9 C) (Oral)   Ht '5\' 8"'$  (1.727 m)   Wt 199 lb (90.3 kg)   BMI 30.26 kg/m  General:   Alert and oriented. No distress noted. Pleasant and cooperative.  Head:  Normocephalic and atraumatic. Eyes:  Conjuctiva clear without scleral icterus. Mouth:  Oral mucosa pink and moist. Good dentition. No lesions. Heart: Normal rate and rhythm, s1 and s2 heart sounds present.  Lungs: Clear lung sounds in all lobes. Respirations equal and unlabored. Abdomen:  +BS, soft, non-distended. No rebound or guarding. No HSM or masses noted. Mild TTP of upper abdomen Derm: No palmar erythema or jaundice Msk:  Symmetrical without gross deformities. Normal posture. Extremities:  Without edema. Neurologic:  Alert and  oriented x4 Psych:  Alert and cooperative. Normal mood and affect.    ASSESSMENT: Michael Odom is a 70 y.o. male presenting today for worsening dysphagia over the past 6 weeks. States that he was initially only having difficulty swallowing thick meats and pills but for the past 2 weeks, he has hardly been able to even swallow liquids. Can sometimes get some ensure/boost shake down but he has to take very small sips. Has lost about 45 pounds due to being unable to eat. He denies any melena, hematochezia, nausea, vomiting, decreased appetite, no chronic NSAID use. He does endorse chronic diffuse upper abdominal pain with no relieving or precipitating factors. We will need to proceed with EGD to rule out stricture, infection  or malignancy. No prior EGD.   PLAN:  Continue boost/ensure as tolerated 2. Schedule EGD for tomorrow  Follow Up: 6 weeks  Case discussed with Dr. Jenetta Downer who is in agreement with plan of care, as outlined above.   Quade Ramirez L. Alver Sorrow, MSN, APRN, AGNP-C Adult-Gerontology Nurse Practitioner [facility]

## 2020-11-02 NOTE — Progress Notes (Signed)
Referring Provider: Garnet Sierras, NP Primary Care Physician:  Garnet Sierras, NP Primary GI Physician: Rehman  Chief Complaint  Patient presents with   Dysphagia    Trouble swallowing for past 6 weeks. States he has lost 46lbs in 6 weeks. States everything comes back up. Nausea, vomiting, abd pain, has one bm about every 2 -3 days,    HPI:   Michael Odom is a 70 y.o. male with past medical history of HTN, high cholesterol, sleep apnea, DM, Colonic polyps, and gout.   Patient presenting today for new issues with swallowing and weight loss.  Dysphagia:states that for a while he was having issues with meat and pills getting stuck when swallowing, about 6 weeks ago it became worse, with everything he ate or drank coming back up. States that even drinking water or boost shakes at this point is difficult. He reports that he can sometimes do ice chips. Anything he tries to eat or drinks stops in mid esophagus and he has to bring it back up.   States he has lost around 45 pounds in 6 weeks. Reports upper abdominal pain that is constant, no relieving factors. Denies nausea. Denies melena or hematochezia. Denies any frequent NSAID use. Denies any previous history of dysphagia, no prior EGD.  Has BM every 2-3 days, denies constipation or diarrhea.   Last Colonoscopy:(04/21/19)- Two 5 mm polyps in the proximal transverse colon, (tubular adenoma) - One small polyp in the proximal sigmoid colon. Biopsied. - Diverticulosis in the sigmoid colon. - External hemorrhoids. Last Endoscopy:n/a  Recommendations:  EGD  Past Medical History:  Diagnosis Date   Acute infective otitis externa, unspecified laterality    Arthritis    Chronic ear infection    Essential (primary) hypertension    Frequency of urination    Gout, unspecified    High blood pressure    High cholesterol    History of colon polyps    History of MRSA infection    2003--  RIGHT KNEE ABSCESS   Hyperlipidemia     Hyperlipidemia, unspecified    Nocturia    OSA (obstructive sleep apnea)    2000--  POSITIVE SLEEP STUDY /  NON-COMPLIANT CPAP   Right hydrocele    Sleep apnea, unspecified    Type 2 diabetes mellitus ([place])    Type 2 diabetes mellitus with hyperglycemia ([place])    Type 2 diabetes mellitus with other specified complication ([place])    Urgency of urination     Past Surgical History:  Procedure Laterality Date   COLONOSCOPY N/A 12/29/2013   Procedure: COLONOSCOPY;  Surgeon: Rogene Houston, MD;  [facility];  Service: Endoscopy;  Laterality: N/A;  1200   COLONOSCOPY N/A 04/21/2019   Procedure: COLONOSCOPY;  Surgeon: Rogene Houston, MD;  [facility];  Service: Endoscopy;  Laterality: N/A;  1055   HYDROCELE EXCISION Right 01/11/2014   Procedure: RIGHT HYDROCELECTOMY ADULT;  Surgeon: Malka So, MD;  [facility];  Service: Urology;  Laterality: Right;   I  &  D RIGHT KNEE ABSCESS  2003   POLYPECTOMY  04/21/2019   Procedure: POLYPECTOMY;  Surgeon: Rogene Houston, MD;  [facility];  Service: Endoscopy;;    Current Outpatient Medications  Medication Sig Dispense Refill   ciprofloxacin-dexamethasone (CIPRODEX) otic suspension Place 2 drops into both ears 4 (four) times daily as needed (ear irritation/infection.).      doxycycline (VIBRA-TABS) 100 MG tablet Take 100 mg  by mouth See admin instructions. Take 1 tablet (100 mg) by mouth scheduled at night, may take an additional dose in the morning if needed for rosacea flare ups     fluticasone (FLONASE) 50 MCG/ACT nasal spray Place 2 sprays into both nostrils daily. In the morning.     folic acid (FOLVITE) Q000111Q MCG tablet Take 800 mcg by mouth every evening.     metFORMIN (GLUCOPHAGE) 500 MG tablet Take 500 mg by mouth 2 (two) times daily with a meal.     metroNIDAZOLE (METROCREAM) 0.75 % cream Apply 1 application topically daily.     Multiple Vitamin (MULTIVITAMIN WITH MINERALS) TABS  tablet Take 1 tablet by mouth daily. Centrum Silver     Omega-3 Fatty Acids (DIALYVITE OMEGA-3 CONCENTRATE) 600 MG CAPS Take 600 mg by mouth every evening.     simvastatin (ZOCOR) 40 MG tablet Take 40 mg by mouth at bedtime.     SYSTANE COMPLETE 0.6 % SOLN Place 1 drop into both eyes daily.     vitamin B-12 (CYANOCOBALAMIN) 1000 MCG tablet Take 1,000 mcg by mouth every evening.     No current facility-administered medications for this visit.    Allergies as of 11/02/2020 - Review Complete 11/02/2020  Allergen Reaction Noted   Guaifenesin & derivatives Itching and Rash 12/15/2013   Sulfa antibiotics Swelling and Rash 12/15/2013   Vancomycin Other (See Comments) 12/15/2013   Neosporin [neomycin-bacitracin zn-polymyx] Itching and Rash 01/07/2014    Family History  Problem Relation Age of Onset   Colon cancer Neg Hx     Social History   Socioeconomic History   Marital status: Married    Spouse name: Not on file   Number of children: Not on file   Years of education: Not on file   Highest education level: Not on file  Occupational History   Not on file  Tobacco Use   Smoking status: Former    Packs/day: 1.00    Years: 30.00    Pack years: 30.00    Types: Cigarettes    Quit date: 12/30/1995    Years since quitting: 24.8   Smokeless tobacco: Former    Types: Snuff    Quit date: 01/07/1994  Substance and Sexual Activity   Alcohol use: No   Drug use: No   Sexual activity: Not on file  Other Topics Concern   Not on file  Social History Narrative   Not on file   Social Determinants of Health   Financial Resource Strain: Not on file  Food Insecurity: Not on file  Transportation Needs: Not on file  Physical Activity: Not on file  Stress: Not on file  Social Connections: Not on file   Review of Systems: Gen: Denies fever, chills, anorexia. Denies fatigue, weakness. +weight loss CV: Denies chest pain, palpitations, syncope, peripheral edema, and claudication. Resp:  Denies dyspnea at rest, cough, wheezing, coughing up blood, and pleurisy. GI: Denies vomiting blood, jaundice, and fecal incontinence. + dysphagia, abdomianl pain Derm: Denies rash, itching, dry skin Psych: Denies depression, anxiety, memory loss, confusion. No homicidal or suicidal ideation.  Heme: Denies bruising, bleeding, and enlarged lymph nodes.  Physical Exam: BP (!) 142/83 (BP Location: Left Arm, Patient Position: Sitting, Cuff Size: Large)   Pulse (!) 101   Temp 98.5 F (36.9 C) (Oral)   Ht '5\' 8"'$  (1.727 m)   Wt 199 lb (90.3 kg)   BMI 30.26 kg/m  General:   Alert and oriented. No distress noted. Pleasant and cooperative.  Head:  Normocephalic and atraumatic. Eyes:  Conjuctiva clear without scleral icterus. Mouth:  Oral mucosa pink and moist. Good dentition. No lesions. Heart: Normal rate and rhythm, s1 and s2 heart sounds present.  Lungs: Clear lung sounds in all lobes. Respirations equal and unlabored. Abdomen:  +BS, soft, non-distended. No rebound or guarding. No HSM or masses noted. Mild TTP of upper abdomen Derm: No palmar erythema or jaundice Msk:  Symmetrical without gross deformities. Normal posture. Extremities:  Without edema. Neurologic:  Alert and  oriented x4 Psych:  Alert and cooperative. Normal mood and affect.    ASSESSMENT: JAUN Michael Odom is a 70 y.o. male presenting today for worsening dysphagia over the past 6 weeks. States that he was initially only having difficulty swallowing thick meats and pills but for the past 2 weeks, he has hardly been able to even swallow liquids. Can sometimes get some ensure/boost shake down but he has to take very small sips. Has lost about 45 pounds due to being unable to eat. He denies any melena, hematochezia, nausea, vomiting, decreased appetite, no chronic NSAID use. He does endorse chronic diffuse upper abdominal pain with no relieving or precipitating factors. We will need to proceed with EGD to rule out stricture, infection  or malignancy. No prior EGD.   PLAN:  Continue boost/ensure as tolerated 2. Schedule EGD for tomorrow  Follow Up: 6 weeks  Case discussed with Dr. Jenetta Downer who is in agreement with plan of care, as outlined above.   Kashten Gowin L. Alver Sorrow, MSN, APRN, AGNP-C Adult-Gerontology Nurse Practitioner [facility]

## 2020-11-03 ENCOUNTER — Ambulatory Visit (HOSPITAL_COMMUNITY)
Admission: RE | Admit: 2020-11-03 | Discharge: 2020-11-03 | Disposition: A | Discharge status: Home / Self Care | Payer: Medicare Other | Attending: Gastroenterology | Admitting: Gastroenterology

## 2020-11-03 ENCOUNTER — Other Ambulatory Visit: Payer: Self-pay

## 2020-11-03 ENCOUNTER — Ambulatory Visit (HOSPITAL_COMMUNITY): Payer: Medicare Other | Admitting: Anesthesiology

## 2020-11-03 ENCOUNTER — Encounter (HOSPITAL_COMMUNITY)
Admission: RE | Disposition: A | Discharge status: Home / Self Care | Payer: Self-pay | Source: Home / Self Care | Attending: Gastroenterology

## 2020-11-03 ENCOUNTER — Encounter (HOSPITAL_COMMUNITY): Payer: Self-pay | Admitting: Gastroenterology

## 2020-11-03 DIAGNOSIS — G4733 Obstructive sleep apnea (adult) (pediatric): Secondary | ICD-10-CM | POA: Insufficient documentation

## 2020-11-03 DIAGNOSIS — K2100 Gastro-esophageal reflux disease with esophagitis, without bleeding: Secondary | ICD-10-CM | POA: Diagnosis not present

## 2020-11-03 DIAGNOSIS — Z7984 Long term (current) use of oral hypoglycemic drugs: Secondary | ICD-10-CM | POA: Insufficient documentation

## 2020-11-03 DIAGNOSIS — Z8614 Personal history of Methicillin resistant Staphylococcus aureus infection: Secondary | ICD-10-CM | POA: Insufficient documentation

## 2020-11-03 DIAGNOSIS — Z79899 Other long term (current) drug therapy: Secondary | ICD-10-CM | POA: Insufficient documentation

## 2020-11-03 DIAGNOSIS — C160 Malignant neoplasm of cardia: Secondary | ICD-10-CM | POA: Insufficient documentation

## 2020-11-03 DIAGNOSIS — I10 Essential (primary) hypertension: Secondary | ICD-10-CM | POA: Insufficient documentation

## 2020-11-03 DIAGNOSIS — K222 Esophageal obstruction: Secondary | ICD-10-CM | POA: Diagnosis not present

## 2020-11-03 DIAGNOSIS — Z882 Allergy status to sulfonamides status: Secondary | ICD-10-CM | POA: Diagnosis not present

## 2020-11-03 DIAGNOSIS — Z881 Allergy status to other antibiotic agents status: Secondary | ICD-10-CM | POA: Diagnosis not present

## 2020-11-03 DIAGNOSIS — E7800 Pure hypercholesterolemia, unspecified: Secondary | ICD-10-CM | POA: Diagnosis not present

## 2020-11-03 DIAGNOSIS — D490 Neoplasm of unspecified behavior of digestive system: Secondary | ICD-10-CM | POA: Diagnosis not present

## 2020-11-03 DIAGNOSIS — E119 Type 2 diabetes mellitus without complications: Secondary | ICD-10-CM | POA: Insufficient documentation

## 2020-11-03 DIAGNOSIS — Z87891 Personal history of nicotine dependence: Secondary | ICD-10-CM | POA: Insufficient documentation

## 2020-11-03 DIAGNOSIS — Z888 Allergy status to other drugs, medicaments and biological substances status: Secondary | ICD-10-CM | POA: Diagnosis not present

## 2020-11-03 DIAGNOSIS — R1310 Dysphagia, unspecified: Secondary | ICD-10-CM

## 2020-11-03 HISTORY — PX: BIOPSY: SHX5522

## 2020-11-03 HISTORY — PX: ESOPHAGOGASTRODUODENOSCOPY (EGD) WITH PROPOFOL: SHX5813

## 2020-11-03 LAB — GLUCOSE, CAPILLARY
Glucose-Capillary: 137 mg/dL — ABNORMAL HIGH (ref 70–99)
Glucose-Capillary: 102 mg/dL — ABNORMAL HIGH (ref 70–99)

## 2020-11-03 SURGERY — ESOPHAGOGASTRODUODENOSCOPY (EGD) WITH PROPOFOL
Anesthesia: General

## 2020-11-03 MED ORDER — LIDOCAINE HCL (CARDIAC) PF 100 MG/5ML IV SOSY
PREFILLED_SYRINGE | INTRAVENOUS | Status: DC | PRN
Start: 2020-11-03 — End: 2020-11-03
  Administered 2020-11-03: 50 mg via INTRAVENOUS

## 2020-11-03 MED ORDER — STERILE WATER FOR IRRIGATION IR SOLN
Status: DC | PRN
  Administered 2020-11-03: 100 mL

## 2020-11-03 MED ORDER — LACTATED RINGERS IV SOLN: INTRAVENOUS | Status: DC

## 2020-11-03 MED ORDER — PROPOFOL 10 MG/ML IV BOLUS
INTRAVENOUS | Status: DC | PRN
  Administered 2020-11-03: 100 mg via INTRAVENOUS

## 2020-11-03 MED ORDER — PROPOFOL 500 MG/50ML IV EMUL
INTRAVENOUS | Status: DC | PRN
  Administered 2020-11-03: 150 ug/kg/min via INTRAVENOUS

## 2020-11-03 NOTE — Discharge Instructions (Addendum)
You are being discharged to home.  Take a clear liquid diet.  We are waiting for your pathology results.  Will need a CT chest/abdomen/pelvis with IV contrast   Left Voicemail For Leann Lovelace at Dr. Sanjuana Letters office in regards to scheduling for CT.  Patient wife Manuela Schwartz will call office if patient goes to [place] over the weekend

## 2020-11-03 NOTE — Progress Notes (Signed)
Dr. Jenetta Downer in to speak to wife Manuela Schwartz, patient regarding procedure and options.  Wife Manuela Schwartz asked for Dr. Jenetta Downer to speak to a future daughter in law Randel Pigg who is Therapist, sports.  Dr. Jenetta Downer on telephone with Randel Pigg, with patient and wife present.

## 2020-11-03 NOTE — Anesthesia Procedure Notes (Signed)
Date/Time: 11/03/2020 12:27 PM Performed by: Orlie Dakin, CRNA Pre-anesthesia Checklist: Patient identified, Emergency Drugs available, Suction available and Patient being monitored Patient Re-evaluated:Patient Re-evaluated prior to induction Oxygen Delivery Method: Nasal cannula Induction Type: IV induction Placement Confirmation: positive ETCO2

## 2020-11-03 NOTE — Op Note (Signed)
[facility] Patient Name: Michael Odom Procedure Date: 11/03/2020 12:11 PM MRN: [id number] Date of Birth: 07/07/1950 Attending MD: Maylon Peppers ,  CSN: [id number] Age: 70 Admit Type: Outpatient Procedure:                Upper GI endoscopy Indications:              Dysphagia Providers:                Maylon Peppers, Caprice Kluver, Raphael Gibney,                            Technician Referring MD:              Medicines:                Monitored Anesthesia Care Complications:            No immediate complications. Estimated Blood Loss:     Estimated blood loss: none. Procedure:                Pre-Anesthesia Assessment:                           - Prior to the procedure, a History and Physical                            was performed, and patient medications, allergies                            and sensitivities were reviewed. The patient's                            tolerance of previous anesthesia was reviewed.                           - The risks and benefits of the procedure and the                            sedation options and risks were discussed with the                            patient. All questions were answered and informed                            consent was obtained.                           - ASA Grade Assessment: II - A patient with mild                            systemic disease.                           After obtaining informed consent, the endoscope was                            passed under direct vision. Throughout the  procedure, the patient's blood pressure, pulse, and                            oxygen saturations were monitored continuously. The                            [id number] scope was introduced through the                            mouth, and advanced to the lower third of                            esophagus. The upper GI endoscopy was accomplished                            without difficulty.  The patient tolerated the                            procedure well. The [id number] scope was                            introduced through the mouth, and advanced to the                            second part of duodenum. Scope In: 12:25:07 PM Scope Out: 12:54:26 PM Total Procedure Duration: 0 hours 29 minutes 19 seconds  Findings:      Fluid was found in the lower third of the esophagus. There was scant       amount of solid food attached to the esophageal walls. Fluid aspiration       was performed.      LA Grade A (one or more mucosal breaks less than 5 mm, not extending       between tops of 2 mucosal folds) esophagitis with no bleeding was found       36 cm from the incisors.      One severe (stenosis; an endoscope cannot pass) stenosis was found 37 to       40 cm from the incisors. This stenosis measured 6 mm (inner diameter) x       3 cm (in length). The stenosis was traversed. Biopsies were taken with a       cold forceps for histology.      A large, infiltrative mass with no bleeding was found in the cardia and       extended through the posterior wall of the stomach. The central area of       the mass was slightly ulcerated and was friable. The mass extended to       the GE junction and compressed it. Multiple biopsies were taken with a       cold forceps for histology.      The examined duodenum was normal. Impression:               - Fluid in the lower third of the esophagus. Fluid                            aspiration performed.                           -  LA Grade A reflux esophagitis with no bleeding.                           - Esophageal stenosis. Biopsied.                           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                            Biopsied.                           - Normal examined duodenum. Moderate Sedation:      Per Anesthesia Care Recommendation:           - Discharge patient to home (ambulatory). If not                             tolerating any oral intake, will need to come to                            the ER.                           - Clear liquid diet.                           - Await pathology results.                           - Will need a CT chest/abdomen/pelvis with IV                            contrast Procedure Code(s):        --- Professional ---                           [phone], Esophagogastroduodenoscopy, flexible,                            transoral; with biopsy, single or multiple Diagnosis Code(s):        --- Professional ---                           K21.00, Gastro-esophageal reflux disease with                            esophagitis, without bleeding                           K22.2, Esophageal obstruction                           D49.0, Neoplasm of unspecified behavior of                            digestive system  R13.10, Dysphagia, unspecified CPT copyright 2019 American Medical Association. All rights reserved. The codes documented in this report are preliminary and upon coder review may  be revised to meet current compliance requirements. Maylon Peppers, MD Maylon Peppers,  11/03/2020 1:08:05 PM This report has been signed electronically. Number of Addenda: 0

## 2020-11-03 NOTE — Interval H&P Note (Signed)
History and Physical Interval Note:  11/03/2020 12:14 PM Michael Odom is a 70 y.o. male with past medical history of HTN, high cholesterol, sleep apnea, DM, Colonic polyps, and gout, comes for evaluation of dysphagia. BP (!) 160/85   Pulse 94   Temp 98.4 F (36.9 C) (Oral)   Resp 19   Ht '5\' 8"'$  (1.727 m)   Wt 90.3 kg   SpO2 99%   BMI 30.26 kg/m   No new complaints since yesterday.  GENERAL: The patient is AO x3, in no acute distress. HEENT: Head is normocephalic and atraumatic. EOMI are intact. Mouth is well hydrated and without lesions. NECK: Supple. No masses LUNGS: Clear to auscultation. No presence of rhonchi/wheezing/rales. Adequate chest expansion HEART: RRR, normal s1 and s2. ABDOMEN: Soft, nontender, no guarding, no peritoneal signs, and nondistended. BS +. No masses. EXTREMITIES: Without any cyanosis, clubbing, rash, lesions or edema. NEUROLOGIC: AOx3, no focal motor deficit. SKIN: no jaundice, no rashes  [name]  has presented today for surgery, with the diagnosis of Dysphagia.  The various methods of treatment have been discussed with the patient and family. After consideration of risks, benefits and other options for treatment, the patient has consented to  Procedure(s) with comments: ESOPHAGOGASTRODUODENOSCOPY (EGD) WITH PROPOFOL (N/A) - 12:30 ESOPHAGEAL DILATION (N/A) as a surgical intervention.  The patient's history has been reviewed, patient examined, no change in status, stable for surgery.  I have reviewed the patient's chart and labs.  Questions were answered to the patient's satisfaction.     [name]

## 2020-11-03 NOTE — Anesthesia Preprocedure Evaluation (Addendum)
Anesthesia Evaluation  Patient identified by MRN, date of birth, ID band Patient awake    Reviewed: Allergy & Precautions, NPO status , Patient's Chart, lab work & pertinent test results  Airway Mallampati: II  TM Distance: >3 FB Neck ROM: Full    Dental  (+) Dental Advisory Given Crowns :   Pulmonary sleep apnea , former smoker,    Pulmonary exam normal breath sounds clear to auscultation       Cardiovascular Exercise Tolerance: Good (-) hypertensionNormal cardiovascular exam Rhythm:Regular Rate:Normal     Neuro/Psych negative psych ROS   GI/Hepatic negative GI ROS, Neg liver ROS,   Endo/Other  diabetes, Well Controlled, Type 2, Oral Hypoglycemic Agents  Renal/GU      Musculoskeletal  (+) Arthritis ,   Abdominal   Peds  Hematology negative hematology ROS (+)   Anesthesia Other Findings   Reproductive/Obstetrics                            Anesthesia Physical Anesthesia Plan  ASA: 2  Anesthesia Plan: General   Post-op Pain Management:    Induction: Intravenous  PONV Risk Score and Plan: Propofol infusion  Airway Management Planned: Nasal Cannula and Natural Airway  Additional Equipment:   Intra-op Plan:   Post-operative Plan:   Informed Consent: I have reviewed the patients History and Physical, chart, labs and discussed the procedure including the risks, benefits and alternatives for the proposed anesthesia with the patient or authorized representative who has indicated his/her understanding and acceptance.     Dental advisory given  Plan Discussed with: CRNA and Surgeon  Anesthesia Plan Comments:         Anesthesia Quick Evaluation

## 2020-11-03 NOTE — Transfer of Care (Signed)
Immediate Anesthesia Transfer of Care Note  Patient: Michael Odom  Procedure(s) Performed: ESOPHAGOGASTRODUODENOSCOPY (EGD) WITH PROPOFOL ESOPHAGEAL DILATION BIOPSY  Patient Location: Endoscopy Unit  Anesthesia Type:General  Level of Consciousness: awake  Airway & Oxygen Therapy: Patient Spontanous Breathing  Post-op Assessment: Report given to RN and Post -op Vital signs reviewed and stable  Post vital signs: Reviewed and stable  Last Vitals:  Vitals Value Taken Time  BP    Temp    Pulse    Resp    SpO2      Last Pain:  Vitals:   11/03/20 1220  TempSrc:   PainSc: 0-No pain      Patients Stated Pain Goal: 5 ([id number])  Complications: No notable events documented.

## 2020-11-03 NOTE — Anesthesia Postprocedure Evaluation (Signed)
Anesthesia Post Note  Patient: Michael Odom  Procedure(s) Performed: ESOPHAGOGASTRODUODENOSCOPY (EGD) WITH PROPOFOL ESOPHAGEAL DILATION BIOPSY  Patient location during evaluation: Endoscopy Anesthesia Type: General Level of consciousness: awake and alert and oriented Pain management: pain level controlled Vital Signs Assessment: post-procedure vital signs reviewed and stable Respiratory status: spontaneous breathing and respiratory function stable Cardiovascular status: blood pressure returned to baseline and stable Postop Assessment: no apparent nausea or vomiting Anesthetic complications: no   No notable events documented.   Last Vitals:  Vitals:   11/03/20 1259 11/03/20 1401  BP: (!) 146/66 (!) 153/92  Pulse: 85   Resp: (!) 88 (!) 86  Temp: 36.9 C   SpO2: 98% 98%    Last Pain:  Vitals:   11/03/20 1401  TempSrc:   PainSc: 0-No pain                 Jeff Frieden C Darianny Momon

## 2020-11-06 ENCOUNTER — Telehealth (INDEPENDENT_AMBULATORY_CARE_PROVIDER_SITE_OTHER): Payer: Self-pay | Admitting: *Deleted

## 2020-11-06 NOTE — Telephone Encounter (Signed)
Per EGD op note - patient needs Chest/A/P CT

## 2020-11-07 LAB — SURGICAL PATHOLOGY

## 2020-11-14 ENCOUNTER — Encounter (HOSPITAL_COMMUNITY): Payer: Self-pay | Admitting: Gastroenterology

## 2020-12-14 ENCOUNTER — Ambulatory Visit (INDEPENDENT_AMBULATORY_CARE_PROVIDER_SITE_OTHER): Payer: Medicare Other | Admitting: Gastroenterology

## 2021-03-14 ENCOUNTER — Encounter (HOSPITAL_COMMUNITY): Payer: Self-pay

## 2021-03-14 ENCOUNTER — Other Ambulatory Visit: Payer: Self-pay

## 2021-03-14 ENCOUNTER — Inpatient Hospital Stay (HOSPITAL_COMMUNITY)
Admission: EM | Admit: 2021-03-14 | Discharge: 2021-04-11 | DRG: 683 | Disposition: E | Payer: Medicare Other | Attending: Internal Medicine | Admitting: Internal Medicine

## 2021-03-14 ENCOUNTER — Observation Stay (HOSPITAL_COMMUNITY): Payer: Medicare Other

## 2021-03-14 DIAGNOSIS — E1169 Type 2 diabetes mellitus with other specified complication: Secondary | ICD-10-CM

## 2021-03-14 DIAGNOSIS — Z87891 Personal history of nicotine dependence: Secondary | ICD-10-CM

## 2021-03-14 DIAGNOSIS — M109 Gout, unspecified: Secondary | ICD-10-CM | POA: Diagnosis present

## 2021-03-14 DIAGNOSIS — Z794 Long term (current) use of insulin: Secondary | ICD-10-CM

## 2021-03-14 DIAGNOSIS — N170 Acute kidney failure with tubular necrosis: Principal | ICD-10-CM | POA: Diagnosis present

## 2021-03-14 DIAGNOSIS — I10 Essential (primary) hypertension: Secondary | ICD-10-CM | POA: Diagnosis present

## 2021-03-14 DIAGNOSIS — C169 Malignant neoplasm of stomach, unspecified: Secondary | ICD-10-CM | POA: Diagnosis present

## 2021-03-14 DIAGNOSIS — I959 Hypotension, unspecified: Secondary | ICD-10-CM | POA: Diagnosis present

## 2021-03-14 DIAGNOSIS — R579 Shock, unspecified: Secondary | ICD-10-CM | POA: Diagnosis present

## 2021-03-14 DIAGNOSIS — J9690 Respiratory failure, unspecified, unspecified whether with hypoxia or hypercapnia: Secondary | ICD-10-CM

## 2021-03-14 DIAGNOSIS — I248 Other forms of acute ischemic heart disease: Secondary | ICD-10-CM | POA: Diagnosis present

## 2021-03-14 DIAGNOSIS — Z931 Gastrostomy status: Secondary | ICD-10-CM

## 2021-03-14 DIAGNOSIS — E785 Hyperlipidemia, unspecified: Secondary | ICD-10-CM

## 2021-03-14 DIAGNOSIS — Z515 Encounter for palliative care: Secondary | ICD-10-CM

## 2021-03-14 DIAGNOSIS — R55 Syncope and collapse: Secondary | ICD-10-CM | POA: Diagnosis not present

## 2021-03-14 DIAGNOSIS — Z66 Do not resuscitate: Secondary | ICD-10-CM | POA: Diagnosis present

## 2021-03-14 DIAGNOSIS — Z882 Allergy status to sulfonamides status: Secondary | ICD-10-CM

## 2021-03-14 DIAGNOSIS — R1310 Dysphagia, unspecified: Secondary | ICD-10-CM | POA: Diagnosis present

## 2021-03-14 DIAGNOSIS — E869 Volume depletion, unspecified: Secondary | ICD-10-CM | POA: Diagnosis present

## 2021-03-14 DIAGNOSIS — E1165 Type 2 diabetes mellitus with hyperglycemia: Secondary | ICD-10-CM | POA: Diagnosis present

## 2021-03-14 DIAGNOSIS — E7800 Pure hypercholesterolemia, unspecified: Secondary | ICD-10-CM | POA: Diagnosis present

## 2021-03-14 DIAGNOSIS — E860 Dehydration: Secondary | ICD-10-CM | POA: Diagnosis present

## 2021-03-14 DIAGNOSIS — Z888 Allergy status to other drugs, medicaments and biological substances status: Secondary | ICD-10-CM

## 2021-03-14 DIAGNOSIS — R54 Age-related physical debility: Secondary | ICD-10-CM | POA: Diagnosis present

## 2021-03-14 DIAGNOSIS — G4733 Obstructive sleep apnea (adult) (pediatric): Secondary | ICD-10-CM | POA: Diagnosis present

## 2021-03-14 DIAGNOSIS — Z79899 Other long term (current) drug therapy: Secondary | ICD-10-CM

## 2021-03-14 DIAGNOSIS — N179 Acute kidney failure, unspecified: Secondary | ICD-10-CM

## 2021-03-14 DIAGNOSIS — Z7984 Long term (current) use of oral hypoglycemic drugs: Secondary | ICD-10-CM

## 2021-03-14 DIAGNOSIS — R7989 Other specified abnormal findings of blood chemistry: Secondary | ICD-10-CM

## 2021-03-14 DIAGNOSIS — M1990 Unspecified osteoarthritis, unspecified site: Secondary | ICD-10-CM | POA: Diagnosis present

## 2021-03-14 DIAGNOSIS — Z881 Allergy status to other antibiotic agents status: Secondary | ICD-10-CM

## 2021-03-14 DIAGNOSIS — E875 Hyperkalemia: Secondary | ICD-10-CM | POA: Diagnosis present

## 2021-03-14 DIAGNOSIS — C160 Malignant neoplasm of cardia: Secondary | ICD-10-CM | POA: Diagnosis present

## 2021-03-14 DIAGNOSIS — Z20822 Contact with and (suspected) exposure to covid-19: Secondary | ICD-10-CM | POA: Diagnosis present

## 2021-03-14 LAB — CBC WITH DIFFERENTIAL/PLATELET
Band Neutrophils: 33 %
Basophils Absolute: 0 10*3/uL (ref 0.0–0.1)
Basophils Relative: 0 %
Eosinophils Absolute: 0 10*3/uL (ref 0.0–0.5)
Eosinophils Relative: 0 %
HCT: 34 % — ABNORMAL LOW (ref 39.0–52.0)
Hemoglobin: 11.2 g/dL — ABNORMAL LOW (ref 13.0–17.0)
Lymphocytes Relative: 8 %
Lymphs Abs: 0.2 10*3/uL — ABNORMAL LOW (ref 0.7–4.0)
MCH: 34.1 pg — ABNORMAL HIGH (ref 26.0–34.0)
MCHC: 32.9 g/dL (ref 30.0–36.0)
MCV: 103.7 fL — ABNORMAL HIGH (ref 80.0–100.0)
Metamyelocytes Relative: 2 %
Monocytes Absolute: 0.1 10*3/uL (ref 0.1–1.0)
Monocytes Relative: 6 %
Neutro Abs: 1.7 10*3/uL (ref 1.7–7.7)
Neutrophils Relative %: 51 %
Platelets: 159 10*3/uL (ref 150–400)
RBC: 3.28 MIL/uL — ABNORMAL LOW (ref 4.22–5.81)
RDW: 18.7 % — ABNORMAL HIGH (ref 11.5–15.5)
WBC: 2 10*3/uL — ABNORMAL LOW (ref 4.0–10.5)
nRBC: 0 % (ref 0.0–0.2)

## 2021-03-14 LAB — BASIC METABOLIC PANEL
Anion gap: 16 — ABNORMAL HIGH (ref 5–15)
BUN: 36 mg/dL — ABNORMAL HIGH (ref 8–23)
CO2: 16 mmol/L — ABNORMAL LOW (ref 22–32)
Calcium: 8.5 mg/dL — ABNORMAL LOW (ref 8.9–10.3)
Chloride: 100 mmol/L (ref 98–111)
Creatinine, Ser: 1.3 mg/dL — ABNORMAL HIGH (ref 0.61–1.24)
GFR, Estimated: 59 mL/min — ABNORMAL LOW (ref >60)
Glucose, Bld: 290 mg/dL — ABNORMAL HIGH (ref 70–99)
Potassium: 5.2 mmol/L — ABNORMAL HIGH (ref 3.5–5.1)
Sodium: 132 mmol/L — ABNORMAL LOW (ref 135–145)

## 2021-03-14 LAB — MAGNESIUM: Magnesium: 2.1 mg/dL (ref 1.7–2.4)

## 2021-03-14 LAB — PHOSPHORUS: Phosphorus: 4.6 mg/dL (ref 2.5–4.6)

## 2021-03-14 LAB — CBG MONITORING, ED: Glucose-Capillary: 246 mg/dL — ABNORMAL HIGH (ref 70–99)

## 2021-03-14 LAB — RESP PANEL BY RT-PCR (FLU A&B, COVID) ARPGX2
Influenza A by PCR: NEGATIVE
Influenza B by PCR: NEGATIVE
SARS Coronavirus 2 by RT PCR: NEGATIVE

## 2021-03-14 MED ORDER — SODIUM CHLORIDE 0.9 % IV BOLUS
250.0000 mL | Freq: Once | INTRAVENOUS | Status: AC
  Administered 2021-03-14: 250 mL via INTRAVENOUS

## 2021-03-14 MED ORDER — LOPERAMIDE HCL 2 MG PO CAPS
4.0000 mg | ORAL_CAPSULE | Freq: Once | ORAL | Status: AC
  Administered 2021-03-14: 4 mg via ORAL
  Filled 2021-03-14: qty 2

## 2021-03-14 MED ORDER — SODIUM CHLORIDE 0.9 % IV SOLN: INTRAVENOUS | Status: DC

## 2021-03-14 MED ORDER — METOPROLOL TARTRATE 5 MG/5ML IV SOLN
2.5000 mg | Freq: Once | INTRAVENOUS | Status: AC
  Administered 2021-03-15: 2.5 mg via INTRAVENOUS
  Filled 2021-03-14: qty 5

## 2021-03-14 MED ORDER — INSULIN DETEMIR 100 UNIT/ML ~~LOC~~ SOLN
14.0000 [IU] | Freq: Every day | SUBCUTANEOUS | Status: DC
  Administered 2021-03-14: 14 [IU] via SUBCUTANEOUS
  Filled 2021-03-14 (×2): qty 0.14

## 2021-03-14 MED ORDER — INSULIN ASPART 100 UNIT/ML IJ SOLN
0.0000 [IU] | INTRAMUSCULAR | Status: DC
  Administered 2021-03-14 – 2021-03-15 (×3): 5 [IU] via SUBCUTANEOUS
  Filled 2021-03-14 (×3): qty 1

## 2021-03-14 MED ORDER — ONDANSETRON HCL 4 MG/2ML IJ SOLN
4.0000 mg | Freq: Four times a day (QID) | INTRAMUSCULAR | Status: DC | PRN
  Filled 2021-03-14: qty 2

## 2021-03-14 MED ORDER — SODIUM CHLORIDE 0.9 % IV BOLUS
2000.0000 mL | Freq: Once | INTRAVENOUS | Status: AC
  Administered 2021-03-14: 2000 mL via INTRAVENOUS

## 2021-03-14 NOTE — ED Notes (Signed)
Pt. Said he felt lightheaded and dizzy while standing. Pt. Couldn't stand the whole time during ortho vs

## 2021-03-14 NOTE — H&P (Signed)
TRH H&P   Patient Demographics:    Michael Odom, is a 71 y.o. male  MRN: [id number]   DOB - [birth year]  Admit Date - 03/25/2021  Outpatient Primary MD for the patient is Nicholes Rough, PA-C  Referring MD/NP/PA: Dr Roderic Palau  Outpatient Specialists: oncology [facility]  Patient coming from: Home  Chief Complaint  Patient presents with   Hypotension      HPI:    Michael Odom  is a 71 y.o. male, past medical history of diabetes mellitus, rosacea, sleep apnea, recent diagnosis of gastric carcinoma, currently on chemotherapy by [facility], status post J-tube insertion. -Presents to ED secondary to generalized weakness, fatigue, and syncope, patient reports he got chemotherapy yesterday, and currently is having prolonged fluorouracil chemotherapy pump, report generalized weakness, fatigue, as well reports he was feeling bloated from tube feed, so he has been holding it for few hours as well, reports dizziness, lightheadedness and syncope with loss of consciousness, no head trauma, no seizure-like activity, no urine or stool incontinence. - in ED sent was orthostatic, afebrile, work-up significant for sodium of 132, potassium of 5.2, creatinine of 1.3, glucose of 290, anion gap of 16, white blood cell of 2, and hemoglobin of 11.2, chest x-ray is pending, UA is pending, blood cultures were sent, ED physician discussed with oncology Dr. Delton Coombes, who recommended admission, blood cultures, IV hydration and to monitor off antibiotics.    Review of systems:    In addition to the HPI above,  No Fever-chills, for generalized weakness, fatigue and syncope No Headache, No changes with Vision or hearing, No problems swallowing food or Liquids, No Chest pain, Cough or Shortness of Breath, No Abdominal pain, No Nausea or Vommitting, Bowel movements are regular, No Blood in  stool or Urine, No dysuria, No new skin rashes or bruises, No new joints pains-aches,  No new weakness, tingling, numbness in any extremity, No recent weight gain or loss, No polyuria, polydypsia or polyphagia, No significant Mental Stressors.  A full 10 point Review of Systems was done, except as stated above, all other Review of Systems were negative.   With Past History of the following :    Past Medical History:  Diagnosis Date   Acute infective otitis externa, unspecified laterality    Arthritis    Chronic ear infection    Essential (primary) hypertension    Frequency of urination    Gout, unspecified    High blood pressure    High cholesterol    History of colon polyps    History of MRSA infection    2003--  RIGHT KNEE ABSCESS   Hyperlipidemia    Hyperlipidemia, unspecified    Nocturia    OSA (obstructive sleep apnea)    2000--  POSITIVE SLEEP STUDY /  NON-COMPLIANT CPAP   Right hydrocele    Sleep apnea, unspecified    Type 2 diabetes mellitus (  [place])    Type 2 diabetes mellitus with hyperglycemia ([place])    Type 2 diabetes mellitus with other specified complication [facility])    Urgency of urination       Past Surgical History:  Procedure Laterality Date   BIOPSY  11/03/2020   Procedure: BIOPSY;  Surgeon: Harvel Quale, MD;  [facility]: [facility];  Service: Gastroenterology;;   COLONOSCOPY N/A 12/29/2013   Procedure: COLONOSCOPY;  Surgeon: Rogene Houston, MD;  Location: [facility];  Service: Endoscopy;  Laterality: N/A;  1200   COLONOSCOPY N/A 04/21/2019   rehman: - Two 5 mm polyps in the proximal transverse colon, removed with a cold snare. Resected   ESOPHAGOGASTRODUODENOSCOPY (EGD) WITH PROPOFOL N/A 11/03/2020   Procedure: ESOPHAGOGASTRODUODENOSCOPY (EGD) WITH PROPOFOL;  Surgeon: Harvel Quale, MD;  Location: [facility];  Service: Gastroenterology;  Laterality: N/A;  12:30   HYDROCELE EXCISION Right 01/11/2014   Procedure: RIGHT  HYDROCELECTOMY ADULT;  Surgeon: Malka So, MD;  Location: [facility];  Service: Urology;  Laterality: Right;   I  &  D RIGHT KNEE ABSCESS  03/11/2001   POLYPECTOMY  04/21/2019   Procedure: POLYPECTOMY;  Surgeon: Rogene Houston, MD;  Location: [facility];  Service: Endoscopy;;      Social History:     Social History   Tobacco Use   Smoking status: Former    Packs/day: 1.00    Years: 30.00    Pack years: 30.00    Types: Cigarettes    Quit date: 12/30/1995    Years since quitting: 25.2   Smokeless tobacco: Former    Types: Snuff    Quit date: 01/07/1994  Substance Use Topics   Alcohol use: No       Family History :     Family History  Problem Relation Age of Onset   Colon cancer Neg Hx      Home Medications:   Prior to Admission medications   Medication Sig Start Date End Date Taking? Authorizing Provider  ciprofloxacin-dexamethasone (CIPRODEX) otic suspension Place 2 drops into both ears 4 (four) times daily as needed (ear irritation/infection.).    Yes [provider]  colchicine 0.6 MG tablet Take 0.6 mg by mouth See admin instructions. Take 2 tablets at the beginning of gout flare up, 1 hour later and then decrease to 1 daily as needed for gout flare ups 11/14/20  Yes [provider]  fentaNYL (DURAGESIC) 25 MCG/HR Place onto the skin every 3 (three) days.   Yes [provider]  fluticasone (FLONASE) 50 MCG/ACT nasal spray Place 2 sprays into both nostrils daily. In the morning.   Yes [provider]  insulin glargine (LANTUS SOLOSTAR) 100 UNIT/ML Solostar Pen Inject 14 Units into the skin at bedtime. 02/13/21  Yes [provider]  loperamide (IMODIUM) 1 MG/5ML solution Place into feeding tube as needed for diarrhea or loose stools. Give 30 mls imodium and 30 water as needed   Yes [provider]  metFORMIN (GLUCOPHAGE) 500 MG tablet Place 500 mg into feeding tube 2 (two) times daily with a meal.    Yes [provider]  metroNIDAZOLE (METROCREAM) 0.75 % cream Apply 1 application topically daily.   Yes [provider]  ondansetron (ZOFRAN) 8 MG tablet Place 8 mg into feeding tube every 8 (eight) hours as needed for nausea or vomiting.   Yes [provider]  ondansetron (ZOFRAN-ODT) 8 MG disintegrating tablet Take by mouth. 01/29/21  Yes  [provider]  potassium chloride 20 MEQ/15ML (10%) SOLN Place 20 mEq into feeding tube every evening. 02/21/21  Yes [provider]  SYSTANE COMPLETE 0.6 % SOLN Place 1 drop into both eyes daily.   Yes [provider]  doxycycline (VIBRA-TABS) 100 MG tablet Take 100 mg by mouth See admin instructions. Take 1 tablet (100 mg) by mouth scheduled at night, may take an additional dose in the morning if needed for rosacea flare ups    [provider]  folic acid (FOLVITE) 591 MCG tablet Take 800 mcg by mouth every evening.    [provider]  Multiple Vitamin (MULTIVITAMIN WITH MINERALS) TABS tablet Take 1 tablet by mouth daily. Centrum Silver    [provider]  Omega-3 Fatty Acids (DIALYVITE OMEGA-3 CONCENTRATE) 600 MG CAPS Take 600 mg by mouth every evening.    [provider]  potassium chloride SA (KLOR-CON M) 20 MEQ tablet Take by mouth daily. 02/12/21   [provider]  prochlorperazine (COMPAZINE) 5 MG tablet Take 1 tablet by mouth every 6 (six) hours as needed. 12/26/20   [provider]  senna-docusate (SENOKOT-S) 8.6-50 MG tablet 1 tablet by Per G Tube route nightly as needed for Constipation. 11/12/20   [provider]  simvastatin (ZOCOR) 40 MG tablet Take 40 mg by mouth at bedtime.    [provider]  vitamin B-12 (CYANOCOBALAMIN) 1000 MCG tablet Take 1,000 mcg by mouth every evening.    [provider]     Allergies:     Allergies  Allergen Reactions   Guaifenesin & Derivatives Itching and Rash   Sulfa Antibiotics  Swelling and Rash    Any thing with sulfa or sulfa deriratives   Vancomycin Other (See Comments)    redman syndrome   Neosporin [Neomycin-Bacitracin Zn-Polymyx] Itching and Rash     Physical Exam:   Vitals  Blood pressure 117/83, pulse (!) 115, temperature 99.2 F (37.3 C), resp. rate 19, height 5\' 8"  (1.727 m), weight 81.6 kg, SpO2 98 %.   1. General frail, chronically appearing male, laying in bed in no apparent distress  2. Normal affect and insight, Not Suicidal or Homicidal, Awake Alert, Oriented X 3.  3. No F.N deficits, ALL C.Nerves Intact, Strength 5/5 all 4 extremities, Sensation intact all 4 extremities, Plantars down going.  4. Ears and Eyes appear Normal, Conjunctivae clear, PERRLA. Moist Oral Mucosa.  5. Supple Neck, No JVD, No cervical lymphadenopathy appriciated, No Carotid Bruits.  6. Symmetrical Chest wall movement, Good air movement bilaterally, CTAB.  7. RRR, No Gallops, Rubs or Murmurs, No Parasternal Heave.  8. Positive Bowel Sounds, Abdomen Soft, No tenderness, No organomegaly appriciated,No rebound -guarding or rigidity.  Patient has G-tube present  9.  No Cyanosis, Normal Skin Turgor, No Skin Rash or Bruise.  10. Good muscle tone,  joints appear normal , no effusions, Normal ROM.  11. No Palpable Lymph Nodes in Neck or Axillae     Data Review:    CBC Recent Labs  Lab 04/03/2021 1738  WBC 2.0*  HGB 11.2*  HCT 34.0*  PLT 159  MCV 103.7*  MCH 34.1*  MCHC 32.9  RDW 18.7*  LYMPHSABS 0.2*  MONOABS 0.1  EOSABS 0.0  BASOSABS 0.0   ------------------------------------------------------------------------------------------------------------------  Chemistries  Recent Labs  Lab 03/22/2021 1738  NA 132*  K 5.2*  CL 100  CO2 16*  GLUCOSE 290*  BUN 36*  CREATININE 1.30*  CALCIUM 8.5*   ------------------------------------------------------------------------------------------------------------------ estimated creatinine  clearance is 50.4  mL/min (A) (by C-G formula based on SCr of 1.3 mg/dL (H)). ------------------------------------------------------------------------------------------------------------------ No results for input(s): TSH, T4TOTAL, T3FREE, THYROIDAB in the last 72 hours.  Invalid input(s): FREET3  Coagulation profile No results for input(s): INR, PROTIME in the last 168 hours. ------------------------------------------------------------------------------------------------------------------- No results for input(s): DDIMER in the last 72 hours. -------------------------------------------------------------------------------------------------------------------  Cardiac Enzymes No results for input(s): CKMB, TROPONINI, MYOGLOBIN in the last 168 hours.  Invalid input(s): CK ------------------------------------------------------------------------------------------------------------------ No results found for: BNP   ---------------------------------------------------------------------------------------------------------------  Urinalysis    Component Value Date/Time   COLORURINE AMBER BIOCHEMICALS MAY BE AFFECTED BY COLOR (A) 12/06/2007 1015   APPEARANCEUR HAZY (A) 12/06/2007 1015   LABSPEC >1.030 (H) 12/06/2007 1015   PHURINE 5.5 12/06/2007 1015   GLUCOSEU NEGATIVE 12/06/2007 1015   HGBUR NEGATIVE 12/06/2007 1015   BILIRUBINUR MODERATE (A) 12/06/2007 1015   KETONESUR TRACE (A) 12/06/2007 1015   PROTEINUR 30 (A) 12/06/2007 1015   UROBILINOGEN 0.2 12/06/2007 1015   NITRITE NEGATIVE 12/06/2007 1015   LEUKOCYTESUR NEGATIVE 12/06/2007 1015    ----------------------------------------------------------------------------------------------------------------   Imaging Results:    No results found.     Assessment & Plan:    Principal Problem:   Syncope Active Problems:   Essential (primary) hypertension   Hyperlipidemia, unspecified   Gout, unspecified   Type 2 diabetes mellitus with other  specified complication (HCC)   Dysphagia   Syncope/generalized weakness and fatigue -This is most likely due to orthostasis, as patient remains orthostatic despite receiving multiple fluid boluses in ED, sitting blood pressure 143/118, standing up 101/83. -Continue with IV fluids, monitor electrolytes closely and replete as needed. -Monitor on telemetry. -Follow-up blood cultures -New with IV fluids -As well this is provoked by him recently receiving chemotherapy, being generally frail, ill and deconditioned, PT/OT consulted.  Gastric/esophageal junctional adenocarcinoma -Patient oncology at [facility], he is currently on chemotherapy, currently having fluorouracil pump, will continue during hospital stay, pump to finish tomorrow. -Patient is strictly n.p.o., all meds and feeds via J-tube, patient on Jevity 1.0 100 cc/h, wife to continue her on home tube feed(she has pump and Jevity 1.0 which we do not have here,, report he was intolerant to all other types of tube feed). -Dr. Delton Coombes consulted by ED -He is currently afebrile, no evidence of infection, so we will monitor off antibiotics, follow-up blood cultures  Diabetes mellitus -Hold metformin, continue with home dose Lantus, will add insulin sliding scale every 4 hours  Hypoatremia -Volume depletion, should correct with normal saline  AKI -Should correct with IV fluids  Hyperkalemia -Potassium of 5.2, monitor on telemetry, repeat in a.m., minimally elevated no intervention currently     DVT Prophylaxis Heparin   AM Labs Ordered, also please review Full Orders  Family Communication: Admission, patients condition and plan of care including tests being ordered have been discussed with the patient and wife who indicate understanding and agree with the plan and Code Status.  Code Status full  Likely DC to  home  Condition GUARDED    Consults called: oncology by ED    Admission status: observation    Time  spent in minutes : 65 minutes   Phillips Climes M.D on 03/23/2021 at 8:48 PM   [facility] - Office  [phone]

## 2021-03-14 NOTE — ED Provider Notes (Signed)
[facility] EMERGENCY DEPARTMENT Provider Note   CSN: [id number] Arrival date & time: 03/22/2021  1704     History  Chief Complaint  Patient presents with   Hypotension    Michael Odom is a 71 y.o. male.  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  He got chemotherapy Tuesday for 6 hours and is on continuous chemotherapy treatment for 48 hours that ends tomorrow.  Patient has been weak dizzy and passed out  The history is provided by the patient and medical records. No language interpreter was used.  Weakness Severity:  Moderate Onset quality:  Sudden Timing:  Constant Progression:  Worsening Chronicity:  Recurrent Context: not alcohol use   Relieved by:  None tried Worsened by:  Nothing Ineffective treatments:  None tried Associated symptoms: no abdominal pain, no chest pain, no cough, no diarrhea, no frequency, no headaches and no seizures       Home Medications Prior to Admission medications   Medication Sig Start Date End Date Taking? Authorizing Provider  ciprofloxacin-dexamethasone (CIPRODEX) otic suspension Place 2 drops into both ears 4 (four) times daily as needed (ear irritation/infection.).     [provider]  doxycycline (VIBRA-TABS) 100 MG tablet Take 100 mg by mouth See admin instructions. Take 1 tablet (100 mg) by mouth scheduled at night, may take an additional dose in the morning if needed for rosacea flare ups    [provider]  fluticasone (FLONASE) 50 MCG/ACT nasal spray Place 2 sprays into both nostrils daily. In the morning.    [provider]  folic acid (FOLVITE) 947 MCG tablet Take 800 mcg by mouth every evening.    [provider]  metFORMIN (GLUCOPHAGE) 500 MG tablet Take 500 mg by mouth 2 (two) times daily with a meal.    [provider]  metroNIDAZOLE (METROCREAM) 0.75 % cream Apply 1 application topically daily.    [provider]  Multiple Vitamin (MULTIVITAMIN WITH MINERALS) TABS  tablet Take 1 tablet by mouth daily. Centrum Silver    [provider]  Omega-3 Fatty Acids (DIALYVITE OMEGA-3 CONCENTRATE) 600 MG CAPS Take 600 mg by mouth every evening.    [provider]  simvastatin (ZOCOR) 40 MG tablet Take 40 mg by mouth at bedtime.    [provider]  SYSTANE COMPLETE 0.6 % SOLN Place 1 drop into both eyes daily.    [provider]  vitamin B-12 (CYANOCOBALAMIN) 1000 MCG tablet Take 1,000 mcg by mouth every evening.    [provider]      Allergies    Guaifenesin & derivatives, Sulfa antibiotics, Vancomycin, and Neosporin [neomycin-bacitracin zn-polymyx]    Review of Systems   Review of Systems  Constitutional:  Negative for appetite change and fatigue.  HENT:  Negative for congestion, ear discharge and sinus pressure.   Eyes:  Negative for discharge.  Respiratory:  Negative for cough.   Cardiovascular:  Negative for chest pain.  Gastrointestinal:  Negative for abdominal pain and diarrhea.  Genitourinary:  Negative for frequency and hematuria.  Musculoskeletal:  Negative for back pain.  Skin:  Negative for rash.  Neurological:  Positive for weakness. Negative for seizures and headaches.  Psychiatric/Behavioral:  Negative for hallucinations.    Physical Exam Updated Vital Signs BP 108/67    Pulse (!) 117    Temp 99.2 F (37.3 C)    Resp 17    Ht 5\' 8"  (1.727 m)    Wt 81.6 kg  SpO2 98%    BMI 27.37 kg/m  Physical Exam Vitals reviewed.  Constitutional:      Appearance: He is well-developed.  HENT:     Head: Normocephalic.     Nose: Nose normal.  Eyes:     General: No scleral icterus.    Conjunctiva/sclera: Conjunctivae normal.  Neck:     Thyroid: No thyromegaly.  Cardiovascular:     Rate and Rhythm: Normal rate and regular rhythm.     Heart sounds: No murmur heard.   No friction rub. No gallop.  Pulmonary:     Breath sounds: No stridor. No wheezing or rales.  Chest:     Chest wall: No tenderness.   Abdominal:     General: There is no distension.     Tenderness: There is abdominal tenderness. There is no rebound.  Musculoskeletal:        General: Normal range of motion.     Cervical back: Neck supple.  Lymphadenopathy:     Cervical: No cervical adenopathy.  Skin:    Findings: No erythema or rash.  Neurological:     Mental Status: He is oriented to person, place, and time.     Motor: No abnormal muscle tone.     Coordination: Coordination normal.  Psychiatric:        Behavior: Behavior normal.    ED Results / Procedures / Treatments   Labs (all labs ordered are listed, but only abnormal results are displayed) Labs Reviewed  CBC WITH DIFFERENTIAL/PLATELET - Abnormal; Notable for the following components:      Result Value   WBC 2.0 (*)    RBC 3.28 (*)    Hemoglobin 11.2 (*)    HCT 34.0 (*)    MCV 103.7 (*)    MCH 34.1 (*)    RDW 18.7 (*)    Lymphs Abs 0.2 (*)    All other components within normal limits  BASIC METABOLIC PANEL - Abnormal; Notable for the following components:   Sodium 132 (*)    Potassium 5.2 (*)    CO2 16 (*)    Glucose, Bld 290 (*)    BUN 36 (*)    Creatinine, Ser 1.30 (*)    Calcium 8.5 (*)    GFR, Estimated 59 (*)    Anion gap 16 (*)    All other components within normal limits  CULTURE, BLOOD (ROUTINE X 2)  CULTURE, BLOOD (ROUTINE X 2)    EKG None  Radiology No results found.  Procedures Procedures    Medications Ordered in ED Medications  sodium chloride 0.9 % bolus 2,000 mL (2,000 mLs Intravenous New Bag/Given 04/03/2021 1745)  loperamide (IMODIUM) capsule 4 mg (4 mg Oral Given 04/06/2021 1812)    ED Course/ Medical Decision Making/ A&P  CRITICAL CARE Performed by: Milton Ferguson Total critical care time: 45 minutes Critical care time was exclusive of separately billable procedures and treating other patients. Critical care was necessary to treat or prevent imminent or life-threatening deterioration. Critical care was time spent  personally by me on the following activities: development of treatment plan with patient and/or surrogate as well as nursing, discussions with consultants, evaluation of patient's response to treatment, examination of patient, obtaining history from patient or surrogate, ordering and performing treatments and interventions, ordering and review of laboratory studies, ordering and review of radiographic studies, pulse oximetry and re-evaluation of patient's condition.  Medical Decision Making  Patient with dehydration AKI questionable blood infection.  I spoke with oncology and they recommended getting blood cultures.  Hold off on antibiotics unless the temperature gets up to 100.5. This patient presents to the ED for concern of weakness and near syncope, this involves an extensive number of treatment options, and is a complaint that carries with it a high risk of complications and morbidity.  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  The pertinent results include: Patient has a low white count of 1.7 with many bands   Imaging Studies ordered: No imaging ordered by me  Cardiac Monitoring:  The patient was maintained on a cardiac monitor.  I personally viewed and interpreted the cardiac monitored which showed an underlying rhythm of: Sinus tach   Medicines ordered and prescription drug management:  I ordered medication including IV fluids for dehydration Reevaluation of the patient after these medicines showed that the patient stayed the same I have reviewed the patients home medicines and have made adjustments as needed   Test  Considered:  None  Critical Interventions:  IV fluids and blood culture   Consultations Obtained:  I requested consultation with the oncology,  and discussed lab and imaging findings as well as pertinent plan - they recommend: They recommended admission blood cultures and hold antibiotics unless the temperature gets to 100.5   Problem List / ED Course:  Dehydration, cancer of the GE junction   Reevaluation:  After the interventions noted above, I reevaluated the patient and found that they have :stayed the same   Social Determinants of Health:  None   Dispostion:  After consideration of the diagnostic results and the patients response to treatment, I feel that the patent would benefit from admission.         Final Clinical Impression(s) / ED Diagnoses Final diagnoses:  AKI (acute kidney injury) [facility])    Rx / [place] Orders ED Discharge Orders     None         Milton Ferguson, MD 03/16/21 1220

## 2021-03-14 NOTE — ED Triage Notes (Signed)
Has continuous chemo, hx of hypotension, had syncopal episode today at home.

## 2021-03-15 ENCOUNTER — Inpatient Hospital Stay (HOSPITAL_COMMUNITY): Payer: Medicare Other

## 2021-03-15 ENCOUNTER — Encounter (HOSPITAL_COMMUNITY): Payer: Self-pay | Admitting: Pulmonary Disease

## 2021-03-15 ENCOUNTER — Other Ambulatory Visit (HOSPITAL_COMMUNITY): Payer: Medicare Other

## 2021-03-15 DIAGNOSIS — Z87891 Personal history of nicotine dependence: Secondary | ICD-10-CM | POA: Diagnosis not present

## 2021-03-15 DIAGNOSIS — M109 Gout, unspecified: Secondary | ICD-10-CM | POA: Diagnosis present

## 2021-03-15 DIAGNOSIS — Z888 Allergy status to other drugs, medicaments and biological substances status: Secondary | ICD-10-CM | POA: Diagnosis not present

## 2021-03-15 DIAGNOSIS — I959 Hypotension, unspecified: Secondary | ICD-10-CM | POA: Diagnosis present

## 2021-03-15 DIAGNOSIS — E860 Dehydration: Secondary | ICD-10-CM | POA: Diagnosis present

## 2021-03-15 DIAGNOSIS — Z882 Allergy status to sulfonamides status: Secondary | ICD-10-CM | POA: Diagnosis not present

## 2021-03-15 DIAGNOSIS — J9601 Acute respiratory failure with hypoxia: Secondary | ICD-10-CM | POA: Diagnosis not present

## 2021-03-15 DIAGNOSIS — Z7984 Long term (current) use of oral hypoglycemic drugs: Secondary | ICD-10-CM | POA: Diagnosis not present

## 2021-03-15 DIAGNOSIS — E1165 Type 2 diabetes mellitus with hyperglycemia: Secondary | ICD-10-CM | POA: Diagnosis present

## 2021-03-15 DIAGNOSIS — J9602 Acute respiratory failure with hypercapnia: Secondary | ICD-10-CM | POA: Diagnosis not present

## 2021-03-15 DIAGNOSIS — R54 Age-related physical debility: Secondary | ICD-10-CM | POA: Diagnosis present

## 2021-03-15 DIAGNOSIS — Z931 Gastrostomy status: Secondary | ICD-10-CM | POA: Diagnosis not present

## 2021-03-15 DIAGNOSIS — E7800 Pure hypercholesterolemia, unspecified: Secondary | ICD-10-CM | POA: Diagnosis present

## 2021-03-15 DIAGNOSIS — R55 Syncope and collapse: Secondary | ICD-10-CM | POA: Diagnosis present

## 2021-03-15 DIAGNOSIS — Z66 Do not resuscitate: Secondary | ICD-10-CM

## 2021-03-15 DIAGNOSIS — Z515 Encounter for palliative care: Secondary | ICD-10-CM | POA: Diagnosis not present

## 2021-03-15 DIAGNOSIS — I248 Other forms of acute ischemic heart disease: Secondary | ICD-10-CM | POA: Diagnosis present

## 2021-03-15 DIAGNOSIS — C160 Malignant neoplasm of cardia: Secondary | ICD-10-CM | POA: Diagnosis present

## 2021-03-15 DIAGNOSIS — I10 Essential (primary) hypertension: Secondary | ICD-10-CM | POA: Diagnosis present

## 2021-03-15 DIAGNOSIS — Z20822 Contact with and (suspected) exposure to covid-19: Secondary | ICD-10-CM | POA: Diagnosis present

## 2021-03-15 DIAGNOSIS — Z794 Long term (current) use of insulin: Secondary | ICD-10-CM | POA: Diagnosis not present

## 2021-03-15 DIAGNOSIS — Z79899 Other long term (current) drug therapy: Secondary | ICD-10-CM | POA: Diagnosis not present

## 2021-03-15 DIAGNOSIS — R579 Shock, unspecified: Secondary | ICD-10-CM | POA: Diagnosis present

## 2021-03-15 DIAGNOSIS — N170 Acute kidney failure with tubular necrosis: Secondary | ICD-10-CM | POA: Diagnosis present

## 2021-03-15 DIAGNOSIS — C169 Malignant neoplasm of stomach, unspecified: Secondary | ICD-10-CM | POA: Diagnosis not present

## 2021-03-15 DIAGNOSIS — M1990 Unspecified osteoarthritis, unspecified site: Secondary | ICD-10-CM | POA: Diagnosis present

## 2021-03-15 DIAGNOSIS — N179 Acute kidney failure, unspecified: Secondary | ICD-10-CM | POA: Diagnosis not present

## 2021-03-15 DIAGNOSIS — G4733 Obstructive sleep apnea (adult) (pediatric): Secondary | ICD-10-CM | POA: Diagnosis present

## 2021-03-15 DIAGNOSIS — Z881 Allergy status to other antibiotic agents status: Secondary | ICD-10-CM | POA: Diagnosis not present

## 2021-03-15 LAB — HEMOGLOBIN AND HEMATOCRIT, BLOOD
HCT: 39.6 % (ref 39.0–52.0)
Hemoglobin: 12.7 g/dL — ABNORMAL LOW (ref 13.0–17.0)

## 2021-03-15 LAB — URINALYSIS, ROUTINE W REFLEX MICROSCOPIC
Bilirubin Urine: NEGATIVE
Glucose, UA: 50 mg/dL — AB
Hgb urine dipstick: NEGATIVE
Ketones, ur: 5 mg/dL — AB
Leukocytes,Ua: NEGATIVE
Nitrite: NEGATIVE
Protein, ur: 100 mg/dL — AB
Specific Gravity, Urine: 1.025 (ref 1.005–1.030)
pH: 5 (ref 5.0–8.0)

## 2021-03-15 LAB — CBG MONITORING, ED
Glucose-Capillary: 218 mg/dL — ABNORMAL HIGH (ref 70–99)
Glucose-Capillary: 220 mg/dL — ABNORMAL HIGH (ref 70–99)
Glucose-Capillary: 222 mg/dL — ABNORMAL HIGH (ref 70–99)

## 2021-03-15 LAB — BLOOD GAS, ARTERIAL
Acid-base deficit: 18.8 mmol/L — ABNORMAL HIGH (ref 0.0–2.0)
Bicarbonate: 9.4 mmol/L — ABNORMAL LOW (ref 20.0–28.0)
Drawn by: 22179
FIO2: 100
O2 Saturation: 93 %
Patient temperature: 36.7
pCO2 arterial: 52.8 mmHg — ABNORMAL HIGH (ref 32.0–48.0)
pH, Arterial: 6.949 — CL (ref 7.350–7.450)
pO2, Arterial: 112 mmHg — ABNORMAL HIGH (ref 83.0–108.0)

## 2021-03-15 LAB — CBC
HCT: 38.8 % — ABNORMAL LOW (ref 39.0–52.0)
Hemoglobin: 12.2 g/dL — ABNORMAL LOW (ref 13.0–17.0)
MCH: 33.8 pg (ref 26.0–34.0)
MCHC: 31.4 g/dL (ref 30.0–36.0)
MCV: 107.5 fL — ABNORMAL HIGH (ref 80.0–100.0)
Platelets: 194 10*3/uL (ref 150–400)
RBC: 3.61 MIL/uL — ABNORMAL LOW (ref 4.22–5.81)
RDW: 18 % — ABNORMAL HIGH (ref 11.5–15.5)
WBC: 1.7 10*3/uL — ABNORMAL LOW (ref 4.0–10.5)
nRBC: 2.3 % — ABNORMAL HIGH (ref 0.0–0.2)

## 2021-03-15 LAB — BASIC METABOLIC PANEL
Anion gap: 16 — ABNORMAL HIGH (ref 5–15)
BUN: 46 mg/dL — ABNORMAL HIGH (ref 8–23)
CO2: 11 mmol/L — ABNORMAL LOW (ref 22–32)
Calcium: 7 mg/dL — ABNORMAL LOW (ref 8.9–10.3)
Chloride: 106 mmol/L (ref 98–111)
Creatinine, Ser: 2.21 mg/dL — ABNORMAL HIGH (ref 0.61–1.24)
GFR, Estimated: 31 mL/min — ABNORMAL LOW (ref >60)
Glucose, Bld: 291 mg/dL — ABNORMAL HIGH (ref 70–99)
Potassium: 4.7 mmol/L (ref 3.5–5.1)
Sodium: 133 mmol/L — ABNORMAL LOW (ref 135–145)

## 2021-03-15 LAB — TROPONIN I (HIGH SENSITIVITY)
Troponin I (High Sensitivity): 85 ng/L — ABNORMAL HIGH (ref <18)
Troponin I (High Sensitivity): 100 ng/L (ref <18)

## 2021-03-15 LAB — D-DIMER, QUANTITATIVE: D-Dimer, Quant: 5.79 ug/mL-FEU — ABNORMAL HIGH (ref 0.00–0.50)

## 2021-03-15 LAB — HEMOGLOBIN A1C
Hgb A1c MFr Bld: 6.6 % — ABNORMAL HIGH (ref 4.8–5.6)
Mean Plasma Glucose: 142.72 mg/dL

## 2021-03-15 LAB — LACTIC ACID, PLASMA: Lactic Acid, Venous: 6.3 mmol/L (ref 0.5–1.9)

## 2021-03-15 MED ORDER — VASOPRESSIN 20 UNITS/100 ML INFUSION FOR SHOCK
0.0000 [IU]/min | INTRAVENOUS | Status: DC
  Administered 2021-03-15: 0.03 [IU]/min via INTRAVENOUS
  Filled 2021-03-15 (×2): qty 100

## 2021-03-15 MED ORDER — ONDANSETRON HCL 4 MG/2ML IJ SOLN: 4.0000 mg | Freq: Once | INTRAMUSCULAR | Status: DC

## 2021-03-15 MED ORDER — PANTOPRAZOLE SODIUM 40 MG IV SOLR
40.0000 mg | INTRAVENOUS | Status: DC
  Administered 2021-03-15: 40 mg via INTRAVENOUS
  Filled 2021-03-15: qty 40

## 2021-03-15 MED ORDER — HEPARIN SODIUM (PORCINE) 5000 UNIT/ML IJ SOLN: 5000.0000 [IU] | Freq: Three times a day (TID) | INTRAMUSCULAR | Status: DC

## 2021-03-15 MED ORDER — STERILE WATER FOR INJECTION IV SOLN
INTRAVENOUS | Status: DC
  Filled 2021-03-15 (×2): qty 1000

## 2021-03-15 MED ORDER — SODIUM CHLORIDE 0.9 % IV SOLN: 2.0000 g | INTRAVENOUS | Status: DC

## 2021-03-15 MED ORDER — FENTANYL CITRATE PF 50 MCG/ML IJ SOSY
25.0000 ug | PREFILLED_SYRINGE | Freq: Once | INTRAMUSCULAR | Status: AC
  Administered 2021-03-15: 25 ug via INTRAVENOUS
  Filled 2021-03-15: qty 1

## 2021-03-15 MED ORDER — ETOMIDATE 2 MG/ML IV SOLN
INTRAVENOUS | Status: AC
  Administered 2021-03-15: 20 mg via INTRAVENOUS
  Filled 2021-03-15: qty 10

## 2021-03-15 MED ORDER — MIDAZOLAM HCL 2 MG/2ML IJ SOLN
1.0000 mg | INTRAMUSCULAR | Status: DC | PRN
  Filled 2021-03-15: qty 2

## 2021-03-15 MED ORDER — EPINEPHRINE HCL 5 MG/250ML IV SOLN IN NS
0.5000 ug/min | INTRAVENOUS | Status: DC
  Filled 2021-03-15: qty 250

## 2021-03-15 MED ORDER — SODIUM CHLORIDE 0.9 % IV SOLN
0.5000 ug/min | INTRAVENOUS | Status: DC
  Administered 2021-03-15: 20 ug/min via INTRAVENOUS
  Filled 2021-03-15: qty 5

## 2021-03-15 MED ORDER — NOREPINEPHRINE 4 MG/250ML-% IV SOLN
40.0000 ug/min | INTRAVENOUS | Status: DC
  Administered 2021-03-15 (×3): 40 ug/min via INTRAVENOUS
  Filled 2021-03-15 (×2): qty 250

## 2021-03-15 MED ORDER — CHLORHEXIDINE GLUCONATE 0.12% ORAL RINSE (MEDLINE KIT): 15.0000 mL | Freq: Two times a day (BID) | OROMUCOSAL | Status: DC

## 2021-03-15 MED ORDER — DOBUTAMINE IN D5W 4-5 MG/ML-% IV SOLN
2.5000 ug/kg/min | INTRAVENOUS | Status: DC
  Administered 2021-03-15: 2.5 ug/kg/min via INTRAVENOUS

## 2021-03-15 MED ORDER — PANTOPRAZOLE 2 MG/ML SUSPENSION
40.0000 mg | Freq: Every day | ORAL | Status: DC
  Filled 2021-03-15 (×2): qty 20

## 2021-03-15 MED ORDER — PHENYLEPHRINE 40 MCG/ML (10ML) SYRINGE FOR IV PUSH (FOR BLOOD PRESSURE SUPPORT)
80.0000 ug | PREFILLED_SYRINGE | Freq: Once | INTRAVENOUS | Status: AC | PRN
  Administered 2021-03-15: 80 ug via INTRAVENOUS

## 2021-03-15 MED ORDER — EPINEPHRINE HCL 5 MG/250ML IV SOLN IN NS
0.5000 ug/min | INTRAVENOUS | Status: DC
  Administered 2021-03-15: 0.5 ug/min via INTRAVENOUS
  Filled 2021-03-15: qty 250

## 2021-03-15 MED ORDER — LOPERAMIDE HCL 1 MG/7.5ML PO SUSP
2.0000 mg | ORAL | Status: DC | PRN
  Administered 2021-03-15: 2 mg
  Filled 2021-03-15: qty 15

## 2021-03-15 MED ORDER — SODIUM CHLORIDE 0.9 % IV SOLN
2.0000 g | Freq: Once | INTRAVENOUS | Status: AC
  Administered 2021-03-15: 2 g via INTRAVENOUS
  Filled 2021-03-15: qty 2

## 2021-03-15 MED ORDER — CHLORHEXIDINE GLUCONATE CLOTH 2 % EX PADS: 6.0000 | MEDICATED_PAD | Freq: Every day | CUTANEOUS | Status: DC

## 2021-03-15 MED ORDER — NOREPINEPHRINE 4 MG/250ML-% IV SOLN
INTRAVENOUS | Status: AC
  Administered 2021-03-15: 2 ug/min via INTRAVENOUS
  Filled 2021-03-15: qty 250

## 2021-03-15 MED ORDER — SODIUM CHLORIDE 0.9 % IV BOLUS
1000.0000 mL | Freq: Once | INTRAVENOUS | Status: AC
  Administered 2021-03-15: 1000 mL via INTRAVENOUS

## 2021-03-15 MED ORDER — PROCHLORPERAZINE EDISYLATE 10 MG/2ML IJ SOLN: 10.0000 mg | Freq: Four times a day (QID) | INTRAMUSCULAR | Status: DC | PRN

## 2021-03-15 MED ORDER — ORAL CARE MOUTH RINSE: 15.0000 mL | OROMUCOSAL | Status: DC

## 2021-03-15 MED ORDER — ROCURONIUM BROMIDE 10 MG/ML (PF) SYRINGE
PREFILLED_SYRINGE | INTRAVENOUS | Status: AC
  Administered 2021-03-15: 100 mg via INTRAVENOUS
  Filled 2021-03-15: qty 10

## 2021-03-15 MED ORDER — ENOXAPARIN SODIUM 80 MG/0.8ML IJ SOSY
80.0000 mg | PREFILLED_SYRINGE | Freq: Two times a day (BID) | INTRAMUSCULAR | Status: DC
  Administered 2021-03-15 (×2): 80 mg via SUBCUTANEOUS
  Filled 2021-03-15 (×2): qty 0.8

## 2021-03-15 MED ORDER — SODIUM CHLORIDE 0.9 % IV SOLN: INTRAVENOUS | Status: DC | PRN

## 2021-03-15 MED ORDER — PHENYLEPHRINE 40 MCG/ML (10ML) SYRINGE FOR IV PUSH (FOR BLOOD PRESSURE SUPPORT)
80.0000 ug | PREFILLED_SYRINGE | Freq: Once | INTRAVENOUS | Status: AC | PRN
  Administered 2021-03-15: 120 ug via INTRAVENOUS

## 2021-03-15 MED ORDER — DIPHENOXYLATE-ATROPINE 2.5-0.025 MG PO TABS: 1.0000 | ORAL_TABLET | Freq: Once | ORAL | Status: DC

## 2021-03-15 MED ORDER — FENTANYL CITRATE PF 50 MCG/ML IJ SOSY
25.0000 ug | PREFILLED_SYRINGE | INTRAMUSCULAR | Status: DC | PRN
  Filled 2021-03-15: qty 1

## 2021-03-15 MED ORDER — ONDANSETRON HCL 4 MG/2ML IJ SOLN
INTRAMUSCULAR | Status: AC
  Administered 2021-03-15: 4 mg via INTRAVENOUS
  Filled 2021-03-15: qty 2

## 2021-03-15 MED ORDER — NOREPINEPHRINE 4 MG/250ML-% IV SOLN
80.0000 ug/min | INTRAVENOUS | Status: DC
  Administered 2021-03-15 (×2): 80 ug/min via INTRAVENOUS
  Filled 2021-03-15 (×3): qty 250

## 2021-03-15 MED ORDER — ETOMIDATE 2 MG/ML IV SOLN: 20.0000 mg | Freq: Once | INTRAVENOUS | Status: AC

## 2021-03-15 MED ORDER — CIPROFLOXACIN-DEXAMETHASONE 0.3-0.1 % OT SUSP: 2.0000 [drp] | Freq: Four times a day (QID) | OTIC | Status: DC | PRN

## 2021-03-15 MED ORDER — IOHEXOL 350 MG/ML SOLN: 100.0000 mL | Freq: Once | INTRAVENOUS | Status: DC | PRN

## 2021-03-15 MED ORDER — FLUTICASONE PROPIONATE 50 MCG/ACT NA SUSP: 2.0000 | Freq: Every day | NASAL | Status: DC

## 2021-03-15 MED ORDER — LINEZOLID 600 MG/300ML IV SOLN
600.0000 mg | Freq: Two times a day (BID) | INTRAVENOUS | Status: DC
  Administered 2021-03-15: 600 mg via INTRAVENOUS
  Filled 2021-03-15 (×3): qty 300

## 2021-03-15 MED ORDER — ROCURONIUM BROMIDE 50 MG/5ML IV SOLN: 100.0000 mg | Freq: Once | INTRAVENOUS | Status: AC

## 2021-03-15 MED ORDER — SODIUM CHLORIDE 0.9 % IV BOLUS
500.0000 mL | Freq: Once | INTRAVENOUS | Status: AC
  Administered 2021-03-15: 500 mL via INTRAVENOUS

## 2021-03-20 LAB — CULTURE, BLOOD (ROUTINE X 2)
Culture: NO GROWTH
Culture: NO GROWTH
Special Requests: ADEQUATE
Special Requests: ADEQUATE

## 2021-04-11 NOTE — ED Notes (Signed)
Family reported they did not want blood work drawn at this time for comfort reasons.

## 2021-04-11 NOTE — ED Notes (Signed)
Pt's cardiac rhythm showing asystole on the monitor. Both myself and Mardee Postin, RN verified that there were no cardiac sounds auscultated and no pulse was felt. Dr. Mal Misty paged to notify of pt's death. Family at bedside. Time of death called at 38.

## 2021-04-11 NOTE — ED Notes (Signed)
Notified attending that pt was declining.

## 2021-04-11 NOTE — ED Notes (Signed)
Order given by Dr. Mal Misty to restart Epi drip at last dose it was infusing which was 71mcg/min.

## 2021-04-11 NOTE — ED Notes (Signed)
Notified attending that pulse was too high for med surge floor and that it had been consistently high. New order for 250 bolus NS

## 2021-04-11 NOTE — Progress Notes (Signed)
Family declined blood to be drawn from A-line. Stating"  we would like comfort measures."

## 2021-04-11 NOTE — ED Notes (Signed)
Several RN tried and attempted [place] IV. Recruited  ED MD to place a central line.

## 2021-04-11 NOTE — Progress Notes (Signed)
Based on the clinical status of the patient, the chemo pump is no longer a priority. I am authorizing CT imaging of this patient, despite damage that may occur to the chemo pump. I have spoken with Dr. Melodye Ped - radiologist - about this.

## 2021-04-11 NOTE — ED Notes (Signed)
Notified attending that pt is miserable.

## 2021-04-11 NOTE — ED Notes (Addendum)
Dr. Mal Misty paged to notify him of pt's BP dropping to 45/37 after stopping Epi drip. Awaiting response.

## 2021-04-11 NOTE — Consult Note (Signed)
[facility]   Patient name: Michael Odom Admit date: 04/08/2021  DOB: [birth year] LOS: 0  MRN: [id number] Consult date: 03-26-2021  Referring provider: Dr. Mal Misty, [facility]: Shock    History:  71 yo male former smoker being treated with chemotherapy at [facility] for gastric cancer presented to [facility] with weakness, fatigue and syncope.  Received chemotherapy with fluorouracil on 03/13/21.  He developed tachycardia.  D dimer elevated.  Started on lovenox for presumed dx of acute PE.  Developed progressive hypoxia and hypotension.   Required intubation and started on pressors.  Found to have renal failure and acidosis and started on bicarbonate infusion.  Started on Abx for possible sepsis.    Hx from chart, medical team, and family.  Past medical history:  DM type 2, Rosacea, Sleep apnea  Significant events:  1/04 Admit, start lovenox 1/05 intubated, started pressors, started Abx, started bicarb gtt  Studies:  Doppler legs 1/05 >> negative for DVT  Micro:  COVID/flu PCR 1/04 >> negative Blood 1/04 >>  Lines:  ETT 1/05 >>  Rt femoral CVL 1/05 >> A line 1/05 >>   Antibiotics:  Cefepime 1/05 >> Zyvox 1/05 >>   Consults:      Interim history:  BP in 50's on multiple pressors, full vent support.  Hasn't needed any sedation.  Vital signs:  BP (!) 64/34    Pulse (!) 25    Temp 98.1 F (36.7 C) (Oral)    Resp (!) 26    Ht 5\' 8"  (1.727 m)    Wt 81.6 kg    SpO2 (!) 81%    BMI 27.37 kg/m   Intake/output:  I/O last 3 completed shifts: In: 2250 [IV Piggyback:2250] Out: -    Physical exam:   General - unresponsive Eyes - pupils midpoint ENT - ETT in place Cardiac - bradycardic Chest - decreased BS Abdomen - absent bowel sounds Extremities - no cyanosis, clubbing, or edema Skin - no rashes Neuro - no response to stimuli  Best practice:   DVT - lovenox SUP - protonix Nutrition - NPO   Assessment:   Shock, NOS  with differential includes acute pulmonary embolism, acute coronary syndrome with cardiogenic shock, and/or septic shock Acute hypoxic/hypercapnic respiratory failure Anion gap metabolic acidosis with lactic acidosis Acute kidney injury from ATN 2nd to shock and hypoxia DM type 2 poorly controlled with hyperglycemia Gastric/esophageal junction adenocarcinoma stage 3 Chemotherapy induced leukopenia  Plan:  He has failed aggressive therapy and medical status continues to deteriorate.  Family understands that he will not survive this illness and don't want him to suffer.  They are concerned about what would happen if endotracheal tube is removed prior to him passing.  They are in agreement to DNR status.  Also in agreement to start medications as needed to help promote comfort.  At that point, they would want other medications stopped that aren't assisting in his comfort and then allow him to pass peacefully.  D/w Dr. Mal Misty and ER team  Goals of care/Family discussions:  Code status: DNR  Labs:   CMP Latest Ref Rng & Units Mar 26, 2021 03/18/2021 06/30/2018  Glucose 70 - 99 mg/dL 291(H) 290(H) -  BUN 8 - 23 mg/dL 46(H) 36(H) 21  Creatinine 0.61 - 1.24 mg/dL 2.21(H) 1.30(H) 1.2  Sodium 135 - 145 mmol/L 133(L) 132(L) 140  Potassium 3.5 - 5.1 mmol/L 4.7 5.2(H) 4.4  Chloride 98 - 111 mmol/L 106 100 105  CO2 22 - 32  mmol/L 11(L) 16(L) 28(A)  Calcium 8.9 - 10.3 mg/dL 7.0(L) 8.5(L) 9.3  Alkaline Phos 25 - 125 - - 55  AST 14 - 40 - - 21  ALT 10 - 40 - - 26    CBC Latest Ref Rng & Units 03/23/21 23-Mar-2021 03/13/2021  WBC 4.0 - 10.5 K/uL 1.7(L) - 2.0(L)  Hemoglobin 13.0 - 17.0 g/dL 12.2(L) 12.7(L) 11.2(L)  Hematocrit 39.0 - 52.0 % 38.8(L) 39.6 34.0(L)  Platelets 150 - 400 K/uL 194 - 159    ABG    Component Value Date/Time   PHART 6.949 (LL) 03-23-21 0809   PCO2ART 52.8 (H) Mar 23, 2021 0809   PO2ART 112 (H) 03-23-2021 0809   HCO3 9.4 (L) 23-Mar-2021 0809   ACIDBASEDEF 18.8 (H) 2021/03/23 0809    O2SAT 93.0 2021/03/23 0809    CBG (last 3)  Recent Labs    03/23/2021 0004 03-23-21 0537 Mar 23, 2021 0836  GLUCAP 218* 220* 222*     Past surgical history:  He  has a past surgical history that includes Colonoscopy (N/A, 12/29/2013); I  &  D RIGHT KNEE ABSCESS (03/11/2001); Hydrocele surgery (Right, 01/11/2014); Colonoscopy (N/A, 04/21/2019); polypectomy (04/21/2019); Esophagogastroduodenoscopy (egd) with propofol (N/A, 11/03/2020); and biopsy (11/03/2020).  Social history:  He  reports that he quit smoking about 25 years ago. His smoking use included cigarettes. He has a 30.00 pack-year smoking history. He quit smokeless tobacco use about 27 years ago.  His smokeless tobacco use included snuff. He reports that he does not drink alcohol and does not use drugs.   Review of systems:  Unable to obtain  Family history:  His family history is not on file.    Medications:   No current facility-administered medications on file prior to encounter.   Current Outpatient Medications on File Prior to Encounter  Medication Sig   ciprofloxacin-dexamethasone (CIPRODEX) otic suspension Place 2 drops into both ears 4 (four) times daily as needed (ear irritation/infection.).    colchicine 0.6 MG tablet Take 0.6 mg by mouth See admin instructions. Take 2 tablets at the beginning of gout flare up, 1 hour later and then decrease to 1 daily as needed for gout flare ups   fentaNYL (DURAGESIC) 25 MCG/HR Place onto the skin every 3 (three) days.   fluticasone (FLONASE) 50 MCG/ACT nasal spray Place 2 sprays into both nostrils daily. In the morning.   insulin glargine (LANTUS SOLOSTAR) 100 UNIT/ML Solostar Pen Inject 14 Units into the skin at bedtime.   loperamide (IMODIUM) 1 MG/5ML solution Place into feeding tube as needed for diarrhea or loose stools. Give 30 mls imodium as needed   metFORMIN (GLUCOPHAGE) 500 MG tablet Place 500 mg into feeding tube 2 (two) times daily with a meal.   metroNIDAZOLE  (METROCREAM) 0.75 % cream Apply 1 application topically daily.   ondansetron (ZOFRAN) 8 MG tablet Place 8 mg into feeding tube every 8 (eight) hours as needed for nausea or vomiting.   potassium chloride 20 MEQ/15ML (10%) SOLN Place 20 mEq into feeding tube every evening.   SYSTANE COMPLETE 0.6 % SOLN Place 1 drop into both eyes daily.   folic acid (FOLVITE) 213 MCG tablet Take 800 mcg by mouth every evening.   Multiple Vitamin (MULTIVITAMIN WITH MINERALS) TABS tablet Take 1 tablet by mouth daily. Centrum Silver   Omega-3 Fatty Acids (DIALYVITE OMEGA-3 CONCENTRATE) 600 MG CAPS Take 600 mg by mouth every evening.   prochlorperazine (COMPAZINE) 5 MG tablet Take 1 tablet by mouth every 6 (six) hours as needed.  senna-docusate (SENOKOT-S) 8.6-50 MG tablet 1 tablet by Per G Tube route nightly as needed for Constipation.   simvastatin (ZOCOR) 40 MG tablet Take 40 mg by mouth at bedtime.   vitamin B-12 (CYANOCOBALAMIN) 1000 MCG tablet Take 1,000 mcg by mouth every evening.     Critical care time: 39 minutes  Chesley Mires, MD Advance Pager - [phone] 2021/04/06, 2:20 PM

## 2021-04-11 NOTE — ED Notes (Signed)
Pt's wife reports she does not want pt to have a foley catheter inserted.

## 2021-04-11 NOTE — Progress Notes (Signed)
Patient seen and examined at the bedside.  He is intubated and on mechanical ventilation continues on multiple vasopressors but systolic blood pressure is still in the 60s.  He is unstable for transfer to [facility] ICU at this time.  Nurse, family (wife, son and daughter) were updated about plan of care at the bedside.

## 2021-04-11 NOTE — Progress Notes (Signed)
Patient is now intubated and requiring several pressors. I have called and discussed with Dr. Valeta Harms. He has accepted this patient, but reports that the patient can't be transported when he is this hypotensive. Patient is currently on levophed and dobutamine. Dr. Valeta Harms rec's changing dobutamine to epi, and adding vasopressin if needed. There was also concern about why no imaging was done. The admitting hospitalist and I both tried to order CTA, but radiology could not do it because of chemo pump. D-Dimer is elevated, patient is tachy and hypotensive. PE is high on the differential. This was explained to ICU, who reports they have never heard of a pump prohibiting imaging, and rec's getting imaging to rule out PE, bowel ischemia, etc. Patient had normal mentation, and did not complain of abdominal pain prior to intubation, so bowel ischemia is less likely. CTA chest, abd, pelvis has been ordered. Lactic acid and troponin also added on. I have called [facility] to notify of this patient, and also have submitted a new bed request for [facility] ICU.

## 2021-04-11 NOTE — ED Notes (Signed)
This nurse verified there were no cardiac sounds by auscultation and no pulse was felt at 1436.

## 2021-04-11 NOTE — ED Notes (Signed)
Pt's family and wife is requesting that we no longer check blood sugar for comfort reasons.

## 2021-04-11 NOTE — Progress Notes (Addendum)
Patient ID: Michael Odom, male   DOB: 07/24/1950, 71 y.o.   MRN: [id number] Patient remains tachycardic, despite receiving appropriate IV hydration, he does present with syncope, and he is with active malignancy, concern for PE, unfortunately unable to obtain CTA PE protocol as patient still having chemotherapy pump which not to be exposed to radiation,pump to be discontinued tomorrow after treatment is finished, so CTA PE protocol can be done once chemotherapy  pump is discontinued tomorrow, meanwhile  will obtain D-dimers, and if elevated will start on full dose Lovenox till able to obtain CT PE protocol tomorrow after his chemotherapy pump is discontinued.  Addendum: D dimers is elevated at 5,79, started on full dose lovenox, will order  venous dopplers to check for DVT.  Phillips Climes MD

## 2021-04-11 NOTE — ED Notes (Signed)
Notified attending that pressures are consistently soft

## 2021-04-11 NOTE — ED Notes (Signed)
Family updated as to patient's status.

## 2021-04-11 NOTE — Progress Notes (Signed)
Pharmacy Antibiotic Note  Michael Odom is a 71 y.o. male admitted on 03/30/2021 with sepsis.  Pharmacy has been consulted for Cefepime dosing.  Plan: Cefepime 2000 mg IV every 24 hours. Monitor labs, c/s, and patient improvement.  Height: 5\' 8"  (172.7 cm) Weight: 81.6 kg (180 lb) IBW/kg (Calculated) : 68.4  Temp (24hrs), Avg:98.7 F (37.1 C), Min:98.1 F (36.7 C), Max:99.2 F (37.3 C)  Recent Labs  Lab 03/19/2021 1738 Mar 29, 2021 0605  WBC 2.0* 1.7*  CREATININE 1.30* 2.21*    Estimated Creatinine Clearance: 29.7 mL/min (A) (by C-G formula based on SCr of 2.21 mg/dL (H)).    Allergies  Allergen Reactions   Guaifenesin & Derivatives Itching and Rash   Sulfa Antibiotics Swelling and Rash    Any thing with sulfa or sulfa deriratives   Vancomycin Other (See Comments)    redman syndrome   Neosporin [Neomycin-Bacitracin Zn-Polymyx] Itching and Rash    Antimicrobials this admission: Cefepime 1/5 >>   Microbiology results: 1/5 BCx: ngtd 1/5 MRSA PCR: pending  Thank you for allowing pharmacy to be a part of this patients care.  Margot Ables, PharmD Clinical Pharmacist 03/29/2021 10:02 AM

## 2021-04-11 NOTE — ED Provider Notes (Addendum)
EMERGENCY DEPARTMENT CONSULT NOTE  Asked to evaluate admitted patient for low blood pressures and need for vascular access.  Physical Exam  BP (!) 74/57    Pulse (!) 46    Temp 98.1 F (36.7 C) (Oral)    Resp (!) 46    Ht 5\' 8"  (1.727 m)    Wt 81.6 kg    SpO2 99%    BMI 27.37 kg/m   Physical Exam Vitals and nursing note reviewed.  HENT:     Head: Normocephalic.  Eyes:     Pupils: Pupils are equal, round, and reactive to light.  Cardiovascular:     Rate and Rhythm: Tachycardia present.  Pulmonary:     Effort: Tachypnea present.  Abdominal:     Palpations: Abdomen is soft.  Musculoskeletal:        General: Normal range of motion.  Skin:    General: Skin is warm.  Neurological:     General: No focal deficit present.     Mental Status: He is alert.    Procedures  .Central Line  Date/Time: 04/08/21 5:35 AM Performed by: Orpah Greek, MD Authorized by: Orpah Greek, MD   Consent:    Consent obtained:  Verbal   Consent given by:  Patient and spouse   Risks, benefits, and alternatives were discussed: yes     Risks discussed:  Arterial puncture, incorrect placement, infection and bleeding Universal protocol:    Procedure explained and questions answered to patient or proxy's satisfaction: yes     Relevant documents present and verified: yes     Test results available: yes     Imaging studies available: yes     Required blood products, implants, devices, and special equipment available: yes     Site/side marked: yes     Immediately prior to procedure, a time out was called: yes     Patient identity confirmed:  Verbally with patient and hospital-assigned identification number Pre-procedure details:    Indication(s): central venous access     Hand hygiene: Hand hygiene performed prior to insertion     Sterile barrier technique: All elements of maximal sterile technique followed     Skin preparation:  Chlorhexidine   Skin preparation agent: Skin  preparation agent completely dried prior to procedure   Sedation:    Sedation type:  None Anesthesia:    Anesthesia method:  Local infiltration   Local anesthetic:  Lidocaine 1% w/o epi Procedure details:    Location:  R femoral   Site selection rationale:  Anticoagulated and has continous chemo infusion in right chest   Procedural supplies:  Triple lumen   Catheter size:  7 Fr   Landmarks identified: yes     Ultrasound guidance: yes     Ultrasound guidance timing: real time     Sterile ultrasound techniques: Sterile gel and sterile probe covers were used     Number of attempts:  1   Successful placement: yes   Post-procedure details:    Post-procedure:  Dressing applied and line sutured   Assessment:  Blood return through all ports and free fluid flow   Procedure completion:  Tolerated well, no immediate complications Procedure Name: Intubation Date/Time: 2021-04-08 7:05 AM Performed by: Orpah Greek, MD Pre-anesthesia Checklist: Patient identified, Patient being monitored, Emergency Drugs available, Timeout performed and Suction available Oxygen Delivery Method: Non-rebreather mask Preoxygenation: Pre-oxygenation with 100% oxygen Induction Type: Rapid sequence Ventilation: Mask ventilation without difficulty Laryngoscope Size: Glidescope and 4 Grade View: Grade  I Tube size: 7.5 mm Number of attempts: 1 Placement Confirmation: ETT inserted through vocal cords under direct vision, CO2 detector and Breath sounds checked- equal and bilateral Secured at: 23 cm Dental Injury: Teeth and Oropharynx as per pre-operative assessment     .Critical Care Performed by: Orpah Greek, MD Authorized by: Orpah Greek, MD   Critical care provider statement:    Critical care time (minutes):  45   Critical care time was exclusive of:  Separately billable procedures and treating other patients   Critical care was time spent personally by me on the following  activities:  Development of treatment plan with patient or surrogate, discussions with consultants, evaluation of patient's response to treatment, examination of patient, ordering and review of laboratory studies, ordering and review of radiographic studies, ordering and performing treatments and interventions, pulse oximetry, re-evaluation of patient's condition and review of old charts   I assumed direction of critical care for this patient from another provider in my specialty: yes     Care discussed with: admitting provider   ARTERIAL LINE  Date/Time: 04/08/21 6:15 AM Performed by: Orpah Greek, MD Authorized by: Orpah Greek, MD   Consent:    Consent obtained:  Verbal   Consent given by:  Spouse   Risks, benefits, and alternatives were discussed: yes     Risks discussed:  Bleeding, ischemia, infection and pain Universal protocol:    Procedure explained and questions answered to patient or proxy's satisfaction: yes     Relevant documents present and verified: yes     Test results available: yes     Imaging studies available: yes     Required blood products, implants, devices, and special equipment available: yes     Site/side marked: yes     Immediately prior to procedure, a time out was called: yes     Patient identity confirmed:  Hospital-assigned identification number Indications:    Indications: hemodynamic monitoring and multiple ABGs   Pre-procedure details:    Skin preparation:  Alcohol   Preparation: Patient was prepped and draped in sterile fashion   Anesthesia:    Anesthesia method:  None Procedure details:    Location:  L radial   Allen's test performed: no (fingers blanced from norepi)     Placement technique:  Ultrasound guided   Number of attempts:  1 Post-procedure details:    Post-procedure:  Biopatch applied, secured with tape and sterile dressing applied   CMS:  Normal   Procedure completion:  Tolerated well, no immediate  complications  ED Course / MDM    Medical Decision Making  Admitted patient encountered with hypotension.  Patient with elevated D-dimer, inability to have a CT scan.  PE is presumed and patient was empirically treated with Lovenox.  He has a port in his right chest with a continuous chemotherapy infusion.  It was felt that the best site would be the femoral vein because of the ability to compress if there was bleeding as well as to not interfere with his chemo infusion.  Risk and benefits discussed with patient and wife, both consented.  Procedure was done without difficulty.  No complications.  Norepinephrine initiated.  Patient with minimal response.  Bedside echo attempted.  Very difficult to find windows by patient with poor contractility globally.  Art line placed.  Dobutamine added and patient administered Neo-Synephrine IV push x2.  Patient still with minimal blood pressure response.  He became minimally responsive as his blood pressure dropped  below 50 systolic and required intubation.  Wife, son and daughter-in-law at the bedside.  I did have a conversation with them and expressed that I did not feel the patient could survive if his heart stopped.  They confirm that they do not want CPR.       Orpah Greek, MD Mar 31, 2021 0228    Orpah Greek, MD 2021-03-31 [id number]    Orpah Greek, MD 03/16/21 [phone]

## 2021-04-11 NOTE — ED Notes (Signed)
Notified attending that D dimer is high 5.79 and that pulse remains high. New order for lovenox.

## 2021-04-11 NOTE — ED Notes (Addendum)
Chaplain at bedside speaking with family.

## 2021-04-11 NOTE — ED Notes (Signed)
Rsi  BVM 0633 Etom 20mg  adm 0635 Roc 100mg  adm at 0636 0637 23 at teeth 24 @ lip OG 16 placed by EDP

## 2021-04-11 NOTE — ED Notes (Signed)
Notified MD that pulse continues to rise

## 2021-04-11 NOTE — ED Notes (Signed)
Family initiating discussion of organ donation. Dr. Sammuel Bailiff notified and order given to notify [facility]. Spoke with Marybelle Killings from [facility], referral # [id number]. Mr. Mervyn Skeeters reports pt is not suitable for organs or tissues due to his cancer but is suitable for possible cornea donation. Mr. Mervyn Skeeters informed this nurse to call back with time of death if pt were to die.

## 2021-04-11 NOTE — ED Notes (Signed)
Called pharmacy for need for Epi. None available in ED at this time.

## 2021-04-11 NOTE — ED Notes (Signed)
Notified attending that pt o2 is dropping. Pt place on o2 [place] at 3lpm

## 2021-04-11 NOTE — Progress Notes (Signed)
Approximately 0925 decreased FiO2 to 70% and his SpO2 dropped to 88%. Patient returned to 100% FiO2 and SpO2 increased to 94%.

## 2021-04-11 NOTE — Death Summary Note (Addendum)
Death Summary    DEATH SUMMARY   Patient Details  Name: Michael Odom MRN: [id number] DOB: [birth year] FWY:OVZC, Barbera Setters, PA-C  Admission/Discharge Information   Admit Date:  10-Apr-2021  Date of Death: Date of Death: Apr 11, 2021  Time of Death: Time of Death: 23  Length of Stay: 1   Principle Cause of death: Complication from gastric cancer  Hospital Diagnoses: Principal Problem:   Gastric cancer ([place]) Active Problems:   Essential (primary) hypertension   Hyperlipidemia, unspecified   Gout, unspecified   Type 2 diabetes mellitus with other specified complication ([place])   Dysphagia   Syncope   [facility])   Hospital Course:  Mr. [name] was a 71 year old man with medical history significant for diabetes mellitus, rosacea, sleep apnea, recent diagnosis of gastric carcinoma on chemotherapy, s/p G-tube insertion, who presented to the hospital with generalized weakness, fatigue and syncope.  He had chemotherapy the day prior to admission.  He had reported issues like bloating from J-tube feed and so he had held the feeding.  He was hyponatremic and hypotensive and required IV fluids.  He developed acute hypoxemic respiratory failure and acute metabolic encephalopathy (coma) requiring intubation and mechanical ventilation.  Hypotension was refractory to treatment so he was started on multiple vasopressors.  He was also started on empiric IV antibiotics for suspected infection although  no clear source of infection was found.  He was treated with therapeutic Lovenox for suspected pulmonary embolism.  Unfortunately, he could not undergo CTA of the chest to look for pulmonary embolism because he was too unstable for this.  Venous duplex of the lower extremities did not show any DVT.  He developed acute kidney injury with hyperkalemia and severe metabolic acidosis and he was treated with sodium bicarbonate infusion.  His troponins were elevated and this was probably from demand  ischemia.  Unfortunately, he was not getting any better and he remained hypotensive despite aggressive treatment.  After multiple discussions with the family (including wife, son and daughter-in-law), family opted for comfort measures.  Family decided to withdraw vasopressors, antibiotics and IV fluids but wanted to keep patient intubated and on mechanical ventilation and "allow nature to take its course".  Unfortunately, he expired on April 11, 2021 at 2:36 PM.  Exact cause of death is unknown but it is thought to be from complication from gastric cancer.   Procedures: Right femoral central line, intubation and mechanical ventilation  Consultations: Intensivist  The results of significant diagnostics from this hospitalization (including imaging, microbiology, ancillary and laboratory) are listed below for reference.   Significant Diagnostic Studies: US Venous Img Lower Bilateral (DVT)  Result Date: 2021-04-11 CLINICAL DATA:  Elevated D-dimer.  Syncope. EXAM: BILATERAL LOWER EXTREMITY VENOUS DOPPLER ULTRASOUND TECHNIQUE: Gray-scale sonography with graded compression, as well as color Doppler and duplex ultrasound were performed to evaluate the lower extremity deep venous systems from the level of the common femoral vein and including the common femoral, femoral, profunda femoral, popliteal and calf veins including the posterior tibial, peroneal and gastrocnemius veins when visible. The superficial great saphenous vein was also interrogated. Spectral Doppler was utilized to evaluate flow at rest and with distal augmentation maneuvers in the common femoral, femoral and popliteal veins. COMPARISON:  Chest XR, Apr 11, 2021. FINDINGS: RIGHT LOWER EXTREMITY VENOUS Normal compressibility of the RIGHT superficial femoral, and popliteal veins, as well as the visualized calf veins. CFV, profunda femoral vein and great saphenous vein are obscured. No filling defects to suggest DVT on grayscale or color Doppler imaging.  Doppler waveforms show normal direction of venous flow, normal respiratory plasticity and response to augmentation. OTHER No evidence of superficial thrombophlebitis or abnormal fluid collection. Limitations: RIGHT femoral central venous catheter obscures evaluation of the femoral veins and SFJ. LEFT LOWER EXTREMITY VENOUS Normal compressibility of the LEFT common femoral, superficial femoral, and popliteal veins, as well as the visualized calf veins. Visualized portions of profunda femoral vein and great saphenous vein unremarkable. No filling defects to suggest DVT on grayscale or color Doppler imaging. Doppler waveforms show normal direction of venous flow, normal respiratory plasticity and response to augmentation. OTHER No evidence of superficial thrombophlebitis or abnormal fluid collection. Limitations: none IMPRESSION: Suboptimal evaluation, within these constraints; 1. No evidence of femoropopliteal DVT within either lower extremity. 2. RIGHT femoral CVC obscures evaluation of the central femoral veins and SFJ. Michaelle Birks, MD [facility]iology Electronically Signed   By: Michaelle Birks M.D.   On: 20-Mar-2021 10:05   DG CHEST PORT 1 VIEW  Result Date: 2021-03-20 CLINICAL DATA:  71 year old male with weakness, fatigue, syncope. Undergoing chemotherapy. EXAM: PORTABLE CHEST 1 VIEW COMPARISON:  None. FINDINGS: Portable AP supine view at 0855 hours. Intubated. Endotracheal tube tip at the clavicles. Dual lumen right chest power port in place. Enteric tube courses to the abdomen and is looped in the epigastrium, tip not included. There is confluent left lung base opacity obscuring the left hemidiaphragm. Mediastinal contours within normal limits. Elsewhere Allowing for portable technique the lungs are clear. No pneumothorax or pleural effusion is evident on this supine view. Negative visible bowel gas. Degenerative changes in the spine. No acute osseous  abnormality identified. IMPRESSION: 1. Endotracheal tube in good position. Enteric tube courses to the abdomen. 2. Left lower lobe collapse or consolidation. No other acute cardiopulmonary abnormality. Electronically Signed   By: Genevie Ann M.D.   On: 20-Mar-2021 09:05    Microbiology: Recent Results (from the past 240 hour(s))  Blood culture (routine x 2)     Status: None (Preliminary result)   Collection Time: 04/01/2021  8:08 PM   Specimen: BLOOD RIGHT ARM  Result Value Ref Range Status   Specimen Description BLOOD RIGHT ARM  Final   Special Requests   Final    BOTTLES DRAWN AEROBIC AND ANAEROBIC Blood Culture adequate volume   Culture   Final    NO GROWTH < 12 HOURS Performed at [facility], [address] [place], [place] [address]    Report Status PENDING  Incomplete  Blood culture (routine x 2)     Status: None (Preliminary result)   Collection Time: 03/30/2021  8:08 PM   Specimen: BLOOD RIGHT ARM  Result Value Ref Range Status   Specimen Description BLOOD LEFT ARM  Final   Special Requests   Final    BOTTLES DRAWN AEROBIC AND ANAEROBIC Blood Culture adequate volume   Culture   Final    NO GROWTH < 12 HOURS Performed at [facility], [address], [place], [place] [address]    Report Status PENDING  Incomplete  Resp Panel by RT-PCR (Flu A&B, Covid) Nasopharyngeal Swab     Status: None   Collection Time: 03/29/2021  8:32 PM   Specimen: Nasopharyngeal Swab; Nasopharyngeal(NP) swabs in vial transport medium  Result Value Ref Range Status   SARS Coronavirus 2 by RT PCR NEGATIVE NEGATIVE Final    Comment: (NOTE) SARS-CoV-2 target nucleic acids are NOT DETECTED.  The SARS-CoV-2 RNA is generally detectable in upper respiratory specimens during  the acute phase of infection. The lowest concentration of SARS-CoV-2 viral copies this assay can detect is 138 copies/mL. A negative result does not preclude SARS-Cov-2 infection and should not be used as the sole basis for treatment or other  patient management decisions. A negative result may occur with  improper specimen collection/handling, submission of specimen other than nasopharyngeal swab, presence of viral mutation(s) within the areas targeted by this assay, and inadequate number of viral copies(<138 copies/mL). A negative result must be combined with clinical observations, patient history, and epidemiological information. The expected result is Negative.  Fact Sheet for Patients:  EntrepreneurPulse.com.au  Fact Sheet for Healthcare Providers:  IncredibleEmployment.be  This test is no t yet approved or cleared by the [place] FDA and  has been authorized for detection and/or diagnosis of SARS-CoV-2 by FDA under an Emergency Use Authorization (EUA). This EUA will remain  in effect (meaning this test can be used) for the duration of the COVID-19 declaration under Section 564(b)(1) of the Act, 21 U.S.C.section 360bbb-3(b)(1), unless the authorization is terminated  or revoked sooner.       Influenza A by PCR NEGATIVE NEGATIVE Final   Influenza B by PCR NEGATIVE NEGATIVE Final    Comment: (NOTE) The Xpert Xpress SARS-CoV-2/FLU/RSV plus assay is intended as an aid in the diagnosis of influenza from Nasopharyngeal swab specimens and should not be used as a sole basis for treatment. Nasal washings and aspirates are unacceptable for Xpert Xpress SARS-CoV-2/FLU/RSV testing.  Fact Sheet for Patients: EntrepreneurPulse.com.au  Fact Sheet for Healthcare Providers: IncredibleEmployment.be  This test is not yet approved or cleared by the [place] FDA and has been authorized for detection and/or diagnosis of SARS-CoV-2 by FDA under an Emergency Use Authorization (EUA). This EUA will remain in effect (meaning this test can be used) for the duration of the COVID-19 declaration under Section 564(b)(1) of the Act, 21 U.S.C. section  360bbb-3(b)(1), unless the authorization is terminated or revoked.  Performed at [facility], [address], [place], [place] [address]     Time spent: Total critical care time spent was 64 minutes  Signed: Jennye Boroughs 21-Mar-2021

## 2021-04-11 NOTE — ED Notes (Addendum)
Notified attending that pt is in pain and asked for pain medication, Pt alerted this nurse that he uses fentanyl at home. New verbal order from attending for fentanyl

## 2021-04-11 NOTE — ED Provider Notes (Signed)
Signout from Dr. Serena Croissant.  71 year old male history gastric cancer here with weakness fatigue syncope.  Initial plan was for admission.  Patient became hypotensive overnight and is currently on multiple pressors femoral central line intubated.  Patient has been accepted to [facility] ICU.  Awaiting imaging and transfer. Physical Exam  BP (!) 85/67    Pulse (!) 105    Temp 98.1 F (36.7 C) (Oral)    Resp (!) 27    Ht 5\' 8"  (1.727 m)    Wt 81.6 kg    SpO2 (!) 80%    BMI 27.37 kg/m   Physical Exam  Procedures  Procedures  ED Course / MDM    Medical Decision Making  I reached out to pulmonary critical care Dr. Halford Chessman.  He reviewed the patient's medical record and is adding antibiotics and bicarbonate drip.  2 PM.  Dr. Halford Chessman saw the patient and talk with family.  Ultimately they agreed for comfort care.  The nurse informed me that even before she gave him pain medication he became agonal on the monitor.  2 nurses pronounced him and informed the admitting attending.       Hayden Rasmussen, MD Apr 13, 2021 [phone]

## 2021-04-11 DEATH — deceased

## 2021-04-30 ENCOUNTER — Encounter (HOSPITAL_COMMUNITY): Payer: Self-pay

## 2022-03-03 IMAGING — DX DG CHEST 1V PORT
1 series · 1 of 1 positions shown · non-contrast
Comparison: None.

CLINICAL DATA: 70-year-old male with weakness, fatigue, syncope.
Undergoing chemotherapy.

EXAM:
PORTABLE CHEST 1 VIEW

[chest ap]
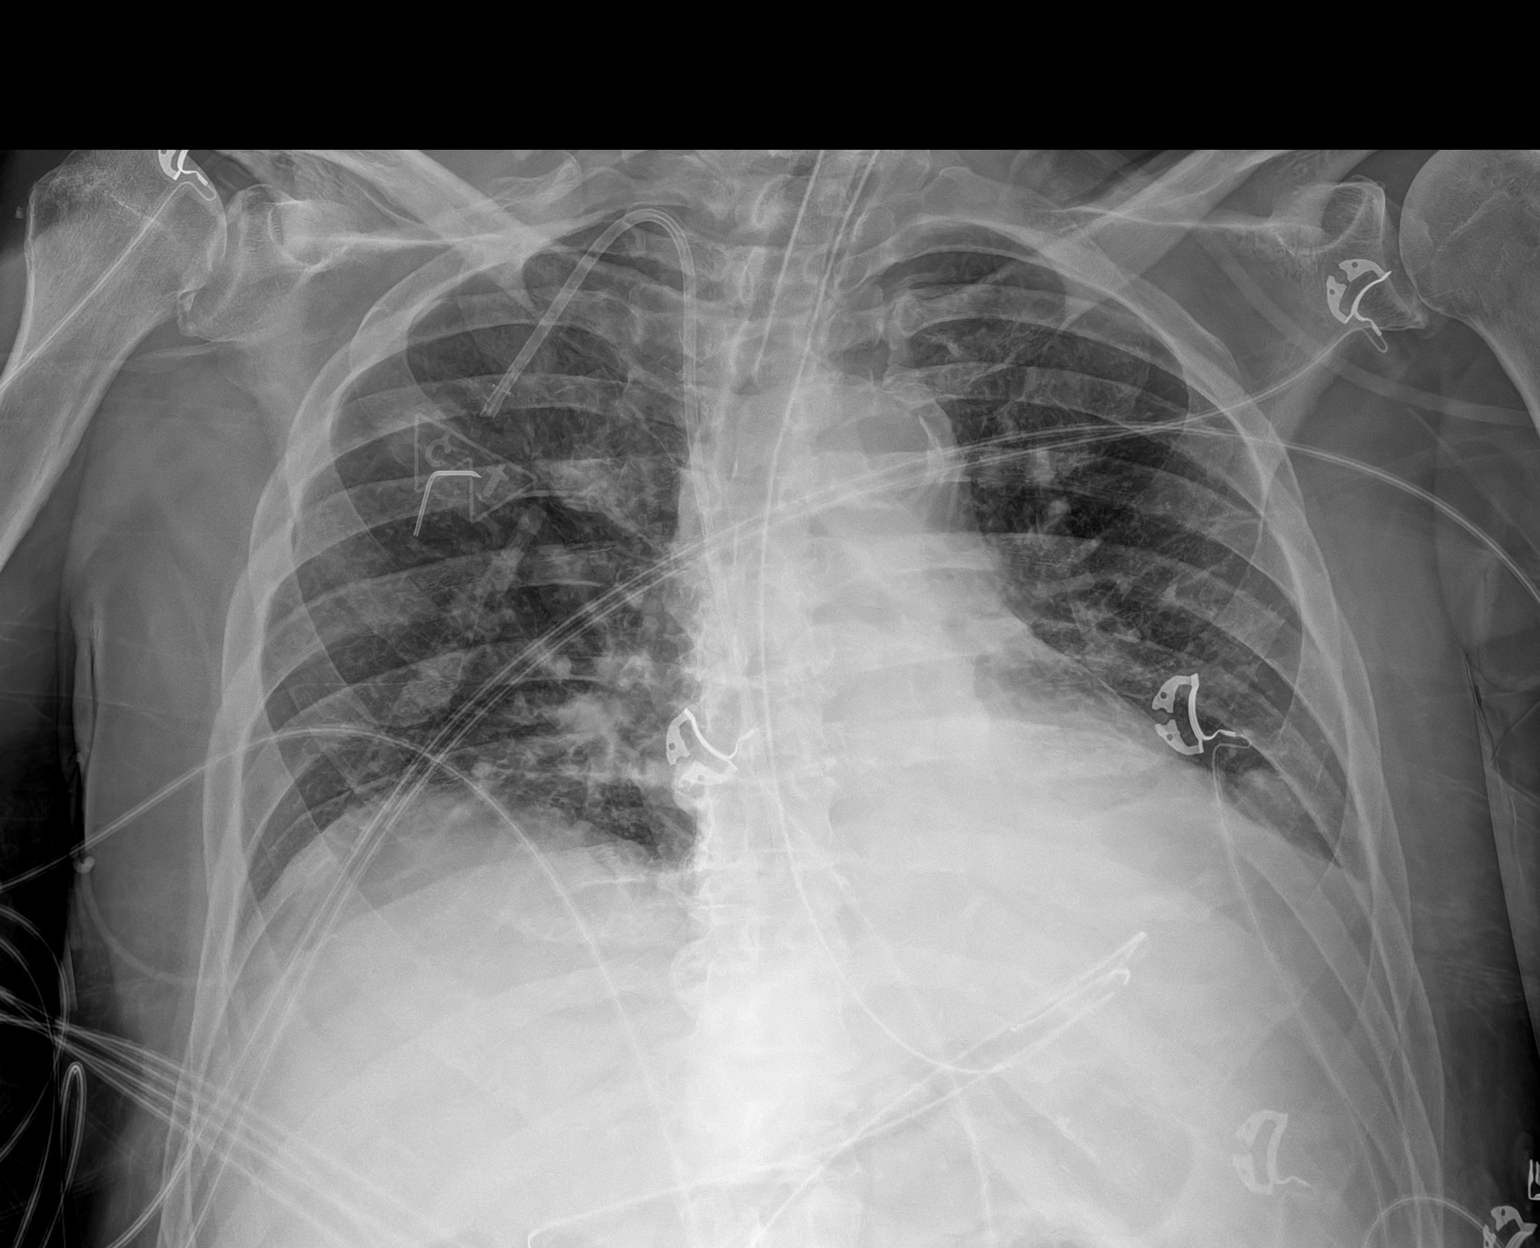

[1 of 1 positions shown; findings below may reference images not displayed]

FINDINGS: Portable AP supine view at 8800 hours. Intubated. Endotracheal tube
tip at the clavicles. Dual lumen right chest power port in place.
Enteric tube courses to the abdomen and is looped in the
epigastrium, tip not included.

There is confluent left lung base opacity obscuring the left
hemidiaphragm. Mediastinal contours within normal limits. Elsewhere
Allowing for portable technique the lungs are clear. No pneumothorax
or pleural effusion is evident on this supine view.

Negative visible bowel gas. Degenerative changes in the spine. No
acute osseous abnormality identified.
IMPRESSION: 1. Endotracheal tube in good position. Enteric tube courses to the
abdomen.
2. Left lower lobe collapse or consolidation. No other acute
cardiopulmonary abnormality.
